# Patient Record
Sex: Female | Born: 1990 | Race: White | Hispanic: No | Marital: Married | Smoking: Never smoker
Health system: Southern US, Community
[De-identification: ages and names within clinical notes are randomized; demographics above are authoritative.]

## PROBLEM LIST (undated history)

## (undated) DIAGNOSIS — F909 Attention-deficit hyperactivity disorder, unspecified type: Secondary | ICD-10-CM

## (undated) DIAGNOSIS — K219 Gastro-esophageal reflux disease without esophagitis: Secondary | ICD-10-CM

## (undated) DIAGNOSIS — F32A Depression, unspecified: Secondary | ICD-10-CM

## (undated) DIAGNOSIS — F419 Anxiety disorder, unspecified: Secondary | ICD-10-CM

## (undated) HISTORY — DX: Depression, unspecified: F32.A

## (undated) HISTORY — DX: Attention-deficit hyperactivity disorder, unspecified type: F90.9

## (undated) HISTORY — DX: Anxiety disorder, unspecified: F41.9

## (undated) HISTORY — PX: WISDOM TOOTH EXTRACTION: SHX21

## (undated) HISTORY — PX: DACRYOCYSTORHINOPLASTY: SHX1437

---

## 2012-09-25 ENCOUNTER — Emergency Department (HOSPITAL_COMMUNITY)
Admission: EM | Admit: 2012-09-25 | Discharge: 2012-09-25 | Disposition: A | Discharge status: Home / Self Care | Payer: Managed Care, Other (non HMO) | Source: Home / Self Care | Attending: Family Medicine | Admitting: Family Medicine

## 2012-09-25 ENCOUNTER — Encounter (HOSPITAL_COMMUNITY): Payer: Self-pay

## 2012-09-25 DIAGNOSIS — R197 Diarrhea, unspecified: Secondary | ICD-10-CM

## 2012-09-25 DIAGNOSIS — R110 Nausea: Secondary | ICD-10-CM

## 2012-09-25 HISTORY — DX: Gastro-esophageal reflux disease without esophagitis: K21.9

## 2012-09-25 LAB — POCT PREGNANCY, URINE: Preg Test, Ur: NEGATIVE

## 2012-09-25 LAB — POCT URINALYSIS DIP (DEVICE)
Bilirubin Urine: NEGATIVE
Ketones, ur: NEGATIVE mg/dL
Leukocytes, UA: NEGATIVE
Protein, ur: NEGATIVE mg/dL
Specific Gravity, Urine: 1.025 (ref 1.005–1.030)

## 2012-09-25 MED ORDER — ONDANSETRON 4 MG PO TBDP: 4.0000 mg | ORAL_TABLET | Freq: Three times a day (TID) | ORAL | Status: DC | PRN

## 2012-09-25 NOTE — ED Notes (Signed)
Patient states that since 09/20/12 she has had nausea and diarrhea in the mornings, states that she has not gotten sick but thought it may be acid reflux, she tried diet change but sx have nor resolved

## 2012-09-25 NOTE — ED Provider Notes (Signed)
History     CSN: [id number]  Arrival date & time 09/25/12  1030   First MD Initiated Contact with Patient 09/25/12 1113      Chief Complaint  Patient presents with  . Nausea    (Consider location/radiation/quality/duration/timing/severity/associated sxs/prior treatment) HPI Comments: Pt had morning nausea 5 days ago and again this AM.  She has been having 4-5 episodes of diarrhea each AM for past 5 days.  No vomiting.  No melena or hematochezia.   No fever or chills.  No UTI sx.  Has changed her diet thinking that was the problem but with no effect.  No abdominal pain.  The history is provided by the patient. No language interpreter was used.    Past Medical History  Diagnosis Date  . GERD (gastroesophageal reflux disease)     History reviewed. No pertinent past surgical history.  No family history on file.  History  Substance Use Topics  . Smoking status: Never Smoker   . Smokeless tobacco: Not on file  . Alcohol Use: Yes    OB History    Grav Para Term Preterm Abortions TAB SAB Ect Mult Living                  Review of Systems  Constitutional: Negative for fever and chills.  Gastrointestinal: Positive for nausea and diarrhea. Negative for vomiting and abdominal pain.  Genitourinary: Negative for dysuria, urgency, frequency, hematuria, vaginal bleeding, vaginal discharge and pelvic pain.  All other systems reviewed and are negative.    Allergies  Review of patient's allergies indicates no known allergies.  Home Medications   Current Outpatient Rx  Name  Route  Sig  Dispense  Refill  . DROSPIRENONE-ETHINYL ESTRADIOL 3-0.03 MG PO TABS   Oral   Take 1 tablet by mouth daily.         Marland Kitchen ONDANSETRON 4 MG PO TBDP   Oral   Take 1 tablet (4 mg total) by mouth every 8 (eight) hours as needed for nausea.   10 tablet   0     BP 121/77  Pulse 106  Temp 98.4 F (36.9 C) (Oral)  Resp 18  SpO2 100%  LMP 09/12/2012  Physical Exam  Nursing note and  vitals reviewed. Constitutional: She is oriented to person, place, and time. She appears well-developed and well-nourished. No distress.  HENT:  Head: Normocephalic and atraumatic.  Eyes: EOM are normal.  Neck: Normal range of motion.  Cardiovascular: Normal rate and regular rhythm.   Pulmonary/Chest: Effort normal.  Abdominal: Soft. Normal appearance and bowel sounds are normal. She exhibits no distension. There is no hepatosplenomegaly. There is no tenderness. There is no rigidity, no rebound, no guarding, no CVA tenderness, no tenderness at McBurney's point and negative Murphy's sign.  Musculoskeletal: Normal range of motion.  Neurological: She is alert and oriented to person, place, and time.  Skin: Skin is warm and dry.  Psychiatric: She has a normal mood and affect. Judgment normal.    ED Course  Procedures (including critical care time)  Labs Reviewed  POCT URINALYSIS DIP (DEVICE) - Abnormal; Notable for the following:    Hgb urine dipstick TRACE (*)     All other components within normal limits  POCT PREGNANCY, URINE   No results found.   1. Nausea   2. Diarrhea       MDM  rx- zofran 4 mg ODT Imodium prn Return pprn        Evalina Field, [place] 09/25/12  1151 

## 2012-09-29 NOTE — ED Provider Notes (Signed)
Medical screening examination/treatment/procedure(s) were performed by resident physician or non-physician practitioner and as supervising physician I was immediately available for consultation/collaboration.   KINDL,JAMES DOUGLAS MD.    James D Kindl, MD 09/29/12 1053 

## 2013-04-16 ENCOUNTER — Emergency Department (INDEPENDENT_AMBULATORY_CARE_PROVIDER_SITE_OTHER): Payer: No Typology Code available for payment source

## 2013-04-16 ENCOUNTER — Emergency Department (HOSPITAL_COMMUNITY)
Admission: EM | Admit: 2013-04-16 | Discharge: 2013-04-16 | Disposition: A | Discharge status: Home / Self Care | Payer: No Typology Code available for payment source | Source: Home / Self Care | Attending: Emergency Medicine | Admitting: Emergency Medicine

## 2013-04-16 ENCOUNTER — Encounter (HOSPITAL_COMMUNITY): Payer: Self-pay | Admitting: Emergency Medicine

## 2013-04-16 DIAGNOSIS — K2970 Gastritis, unspecified, without bleeding: Secondary | ICD-10-CM

## 2013-04-16 DIAGNOSIS — K2990 Gastroduodenitis, unspecified, without bleeding: Secondary | ICD-10-CM

## 2013-04-16 LAB — POCT URINALYSIS DIP (DEVICE)
Bilirubin Urine: NEGATIVE
Ketones, ur: NEGATIVE mg/dL
Leukocytes, UA: NEGATIVE
Protein, ur: NEGATIVE mg/dL
Specific Gravity, Urine: 1.02 (ref 1.005–1.030)
pH: 7 (ref 5.0–8.0)

## 2013-04-16 LAB — CBC WITH DIFFERENTIAL/PLATELET
HCT: 38.3 % (ref 36.0–46.0)
Hemoglobin: 13.2 g/dL (ref 12.0–15.0)
Lymphocytes Relative: 30 % (ref 12–46)
Lymphs Abs: 1.6 10*3/uL (ref 0.7–4.0)
MCHC: 34.5 g/dL (ref 30.0–36.0)
Monocytes Absolute: 0.5 10*3/uL (ref 0.1–1.0)
Monocytes Relative: 10 % (ref 3–12)
Neutro Abs: 3 10*3/uL (ref 1.7–7.7)
Neutrophils Relative %: 56 % (ref 43–77)
RBC: 4.38 MIL/uL (ref 3.87–5.11)

## 2013-04-16 LAB — POCT I-STAT, CHEM 8
BUN: 9 mg/dL (ref 6–23)
Calcium, Ion: 1.21 mmol/L (ref 1.12–1.23)
Chloride: 110 mEq/L (ref 96–112)
Creatinine, Ser: 0.9 mg/dL (ref 0.50–1.10)
Glucose, Bld: 100 mg/dL — ABNORMAL HIGH (ref 70–99)
HCT: 39 % (ref 36.0–46.0)
Potassium: 4 mEq/L (ref 3.5–5.1)

## 2013-04-16 MED ORDER — ESOMEPRAZOLE MAGNESIUM 40 MG PO CPDR: 40.0000 mg | DELAYED_RELEASE_CAPSULE | Freq: Every day | ORAL | Status: DC

## 2013-04-16 MED ORDER — GI COCKTAIL ~~LOC~~
30.0000 mL | Freq: Once | ORAL | Status: AC
  Administered 2013-04-16: 30 mL via ORAL

## 2013-04-16 MED ORDER — DICYCLOMINE HCL 20 MG PO TABS: 20.0000 mg | ORAL_TABLET | Freq: Two times a day (BID) | ORAL | Status: DC

## 2013-04-16 MED ORDER — GI COCKTAIL ~~LOC~~
ORAL | Status: AC
  Filled 2013-04-16: qty 30

## 2013-04-16 MED ORDER — SUCRALFATE 1 G PO TABS: 1.0000 g | ORAL_TABLET | Freq: Four times a day (QID) | ORAL | Status: DC

## 2013-04-16 NOTE — ED Notes (Signed)
Pt c/o epigastric pain onset 3 days. Pain is intermittent and sharp... Last only of a couple of minutes... Taking ibup 200mg  w/no relief.  Denies: v/n/d, constipation, urinary sx She is alert and oriented w/no signs of acute distress.

## 2013-04-16 NOTE — ED Provider Notes (Signed)
Chief Complaint:   Chief Complaint  Patient presents with  . Abdominal Pain    History of Present Illness:    Kathy Mcguire is a 22 year old female who's had a three-day history of mid epigastric pain without radiation that comes and goes. Episodes can occur every 5-10 minutes and lasts a couple of seconds at a time. The pain is rated 5-6/10 in intensity. Nothing seems to precipitate the pain. It's not related to meals, exercise, activity, or exertion. Her appetite has been good and she's had no nausea or vomiting. She denies any constipation, diarrhea, or blood in the stools. 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 Bosie Clos. She's had no fever, chills, history of ulcer or gallbladder disease.  Review of Systems:  Other than noted above, the patient denies any of the following symptoms: Constitutional:  No fever, chills, fatigue, weight loss or anorexia. Lungs:  No cough or shortness of breath. Heart:  No chest pain, palpitations, syncope or edema.  No cardiac history. Abdomen:  No nausea, vomiting, hematememesis, melena, diarrhea, or hematochezia. GU:  No dysuria, frequency, urgency, or hematuria. Gyn:  No vaginal discharge, itching, abnormal bleeding, dyspareunia, or pelvic pain.  PMFSH:  Past medical history, family history, social history, meds, and allergies were reviewed along with nurse's notes.  No prior abdominal surgeries, past history of GI problems, STDs or GYN problems.  No history of aspirin or NSAID use.  No excessive alcohol intake.   Physical Exam:   Vital signs:  BP 116/56  Pulse 96  Temp(Src) 98.4 F (36.9 C) (Oral)  Resp 19  SpO2 100%  LMP 04/16/2013 Gen:  Alert, oriented, in no distress. Lungs:  Breath sounds clear and equal bilaterally.  No wheezes, rales or rhonchi. Heart:  Regular rhythm.  No gallops or murmers.   Abdomen:  Soft, flat, nondistended. No tenderness to palpation, guarding, or rebound. No organomegaly or mass.  Bowel sounds are normally active, Murphy sign and Murphy's punch were negative. Skin:  Clear, warm and dry.  No rash.  Labs:   Results for orders placed during the hospital encounter of 04/16/13  CBC WITH DIFFERENTIAL      Result Value Range   WBC 5.3  4.0 - 10.5 K/uL   RBC 4.38  3.87 - 5.11 MIL/uL   Hemoglobin 13.2  12.0 - 15.0 g/dL   HCT 16.1  09.6 - 04.5 %   MCV 87.4  78.0 - 100.0 fL   MCH 30.1  26.0 - 34.0 pg   MCHC 34.5  30.0 - 36.0 g/dL   RDW 40.9  81.1 - 91.4 %   Platelets 210  150 - 400 K/uL   Neutrophils Relative % 56  43 - 77 %   Neutro Abs 3.0  1.7 - 7.7 K/uL   Lymphocytes Relative 30  12 - 46 %   Lymphs Abs 1.6  0.7 - 4.0 K/uL   Monocytes Relative 10  3 - 12 %   Monocytes Absolute 0.5  0.1 - 1.0 K/uL   Eosinophils Relative 4  0 - 5 %   Eosinophils Absolute 0.2  0.0 - 0.7 K/uL   Basophils Relative 1  0 - 1 %   Basophils Absolute 0.0  0.0 - 0.1 K/uL  POCT URINALYSIS DIP (DEVICE)      Result Value Range   Glucose, UA NEGATIVE  NEGATIVE mg/dL   Bilirubin Urine NEGATIVE  NEGATIVE   Ketones, ur NEGATIVE  NEGATIVE mg/dL  Specific Gravity, Urine 1.020  1.005 - 1.030   Hgb urine dipstick TRACE (*) NEGATIVE   pH 7.0  5.0 - 8.0   Protein, ur NEGATIVE  NEGATIVE mg/dL   Urobilinogen, UA 0.2  0.0 - 1.0 mg/dL   Nitrite NEGATIVE  NEGATIVE   Leukocytes, UA NEGATIVE  NEGATIVE  POCT PREGNANCY, URINE      Result Value Range   Preg Test, Ur NEGATIVE  NEGATIVE  POCT I-STAT, CHEM 8      Result Value Range   Sodium 143  135 - 145 mEq/L   Potassium 4.0  3.5 - 5.1 mEq/L   Chloride 110  96 - 112 mEq/L   BUN 9  6 - 23 mg/dL   Creatinine, Ser 1.61  0.50 - 1.10 mg/dL   Glucose, Bld 096 (*) 70 - 99 mg/dL   Calcium, Ion 0.45  4.09 - 1.23 mmol/L   TCO2 25  0 - 100 mmol/L   Hemoglobin 13.3  12.0 - 15.0 g/dL   HCT 81.1  91.4 - 78.2 %  POCT H PYLORI SCREEN      Result Value Range   H. PYLORI SCREEN, POC NEGATIVE  NEGATIVE     Radiology:  Dg Abd Acute W/chest  04/16/2013    *RADIOLOGY REPORT*  Clinical Data: Epigastric pain  ACUTE ABDOMEN SERIES (ABDOMEN 2 VIEW & CHEST 1 VIEW)  Comparison: None.  Findings: The lungs are clear without focal consolidation, edema, effusion or pneumothorax.  Cardiopericardial silhouette is within normal limits for size.  Imaged bony structures of the thorax are intact.  Upright film shows no evidence for intraperitoneal free air. There is no evidence for gaseous bowel dilation to suggest obstruction. No unexpected abdominopelvic calcification.  Lucency over the inferior midline anatomic pelvis presumed to represent the presence of a tampon. Imaged bony structures are intact.  IMPRESSION: Normal exam.   Original Report Authenticated By: Kennith Center, M.D.   Course in Urgent Care Center:   She was given 30 mL of GI cocktail but this failed to improve her symptoms.  Assessment:  The encounter diagnosis was Gastritis.  Abdominal pain of uncertain cause. I don't see any evidence of gallbladder disease. She's not at all tender in the abdomen, right upper quadrant, and Murphy's sign and Murphy's punch were negative. This may be gastritis or ulcer disease. I think she'll need further evaluation by her gastroenterologist.  Plan:   1.  The following meds were prescribed:   Discharge Medication List as of 04/16/2013  3:49 PM    START taking these medications   Details  dicyclomine (BENTYL) 20 MG tablet Take 1 tablet (20 mg total) by mouth 2 (two) times daily., Starting 04/16/2013, Until Discontinued, Normal    esomeprazole (NEXIUM) 40 MG capsule Take 1 capsule (40 mg total) by mouth daily before breakfast., Starting 04/16/2013, Until Discontinued, Normal    sucralfate (CARAFATE) 1 G tablet Take 1 tablet (1 g total) by mouth 4 (four) times daily., Starting 04/16/2013, Until Discontinued, Normal       2.  The patient was instructed in symptomatic care and handouts were given. 3.  The patient was told to return if becoming worse in any way, if no better  in 3 or 4 days, and given some red flag symptoms such as vomiting, hematemesis, or fever that would indicate earlier return. 4.  Follow up with Dr. Bosie Clos soon as possible.    Reuben Likes, MD 04/16/13 2252

## 2014-11-11 ENCOUNTER — Ambulatory Visit (INDEPENDENT_AMBULATORY_CARE_PROVIDER_SITE_OTHER): Payer: 59 | Admitting: Internal Medicine

## 2014-11-11 VITALS — BP 118/64 | HR 97 | Temp 98.1°F | Resp 16 | Ht 62.25 in | Wt 144.2 lb

## 2014-11-11 DIAGNOSIS — L0591 Pilonidal cyst without abscess: Secondary | ICD-10-CM

## 2014-11-11 MED ORDER — CEPHALEXIN 500 MG PO CAPS: 500.0000 mg | ORAL_CAPSULE | Freq: Four times a day (QID) | ORAL | Status: DC

## 2014-11-12 NOTE — Progress Notes (Signed)
   Subjective:    Patient ID: Kathy Mcguire, female    DOB: 1990/12/09, 24 y.o.   MRN: [id number]  HPI history of 3 days of tenderness around the tailbone without known injury. She feels a swollen area that is tender. No prior history of problems although her brother has required surgery for p. Cyst. No fever.  There are no active problems to display for this patient.  on oral contraceptives    Review of Systems Noncontributory    Objective:   Physical Exam BP 118/64 mmHg  Pulse 97  Temp(Src) 98.1 F (36.7 C) (Oral)  Resp 16  Ht 5' 2.25" (1.581 m)  Wt 144 lb 3.2 oz (65.409 kg)  BMI 26.17 kg/m2  SpO2 98%  LMP 11/11/2014 She is tender midline sacral without redness or palpable swelling or induration except for a small fullness just to the left into the buttock. There are 2 pilonidal dimples.       Assessment & Plan:  Early pilonidal cyst  Hot soaks 20 minutes 3 times a day Keflex Follow-up if this expands for I&D

## 2016-11-29 ENCOUNTER — Ambulatory Visit (INDEPENDENT_AMBULATORY_CARE_PROVIDER_SITE_OTHER): Payer: Managed Care, Other (non HMO)

## 2016-11-29 ENCOUNTER — Ambulatory Visit (INDEPENDENT_AMBULATORY_CARE_PROVIDER_SITE_OTHER): Payer: Managed Care, Other (non HMO) | Admitting: Podiatry

## 2016-11-29 ENCOUNTER — Encounter: Payer: Self-pay | Admitting: Podiatry

## 2016-11-29 VITALS — BP 101/81 | HR 97 | Resp 16 | Ht 64.0 in | Wt 155.0 lb

## 2016-11-29 DIAGNOSIS — M79675 Pain in left toe(s): Secondary | ICD-10-CM | POA: Diagnosis not present

## 2016-11-29 DIAGNOSIS — M2040 Other hammer toe(s) (acquired), unspecified foot: Secondary | ICD-10-CM

## 2016-11-29 DIAGNOSIS — M79674 Pain in right toe(s): Secondary | ICD-10-CM

## 2016-11-29 MED ORDER — DICLOFENAC SODIUM 75 MG PO TBEC: 75.0000 mg | DELAYED_RELEASE_TABLET | Freq: Two times a day (BID) | ORAL | 2 refills | Status: DC

## 2016-11-29 NOTE — Progress Notes (Signed)
   Subjective:    Patient ID: Kathy Mcguire, female    DOB: [birth year], [age] y.o.   MRN: [id number]  HPI Chief Complaint  Patient presents with  . Toe Pain    Bilateral; Toes 2-5; pt stated,"Hurts to touch toes, hurts to bend toes; feel swollen; but has more pain in toes 2-3"; x2-3 days      Review of Systems  All other systems reviewed and are negative.      Objective:   Physical Exam        Assessment & Plan:

## 2016-12-01 NOTE — Progress Notes (Signed)
Subjective:     Patient ID: Kathy Mcguire, female   DOB: January 31, 1991, 26 y.o.   MRN: [id number]  HPI patient states that her toe started to hurt her quite a bit several days earlier and she admits that she had gone out in cold [place] we whether it boots and walked. States she does not remember other injury   Review of Systems  All other systems reviewed and are negative.      Objective:   Physical Exam  Constitutional: She is oriented to person, place, and time.  Cardiovascular: Intact distal pulses.   Musculoskeletal: Normal range of motion.  Neurological: She is oriented to person, place, and time.  Skin: Skin is warm.  Vitals reviewed.  neurovascular status intact muscle strength adequate range of motion within normal limits with patient found to have irritation of the right second digit bilateral dorsum was slight elevation of the toe and no other areas of necrosis with cool type feet and no indications of previous vasospastic disease     Assessment:     Appears to be mild circulatory issues secondary to probable cold exposure with vasospasm with no indications of ulceration    Plan:     H&P x-rays reviewed and advised on gradual warming of the feet keeping them protected with thick socks and applied padding second toe to cushion. If symptoms were to get worse or any physical changes were to occur she is to reappoint immediately  X-ray report indicated that there is no indications of digital deformity or arthritis or other indications of basal condition

## 2017-03-04 ENCOUNTER — Encounter (HOSPITAL_COMMUNITY): Payer: Self-pay

## 2017-03-04 ENCOUNTER — Emergency Department (HOSPITAL_COMMUNITY): Payer: Managed Care, Other (non HMO)

## 2017-03-04 ENCOUNTER — Emergency Department (HOSPITAL_COMMUNITY)
Admission: EM | Admit: 2017-03-04 | Discharge: 2017-03-04 | Disposition: A | Discharge status: Home / Self Care | Payer: Managed Care, Other (non HMO) | Attending: Physician Assistant | Admitting: Physician Assistant

## 2017-03-04 DIAGNOSIS — R071 Chest pain on breathing: Secondary | ICD-10-CM

## 2017-03-04 DIAGNOSIS — R079 Chest pain, unspecified: Secondary | ICD-10-CM | POA: Diagnosis present

## 2017-03-04 DIAGNOSIS — Z79899 Other long term (current) drug therapy: Secondary | ICD-10-CM | POA: Insufficient documentation

## 2017-03-04 LAB — CBC
HEMATOCRIT: 38.1 % (ref 36.0–46.0)
Hemoglobin: 13.4 g/dL (ref 12.0–15.0)
MCH: 30 pg (ref 26.0–34.0)
MCHC: 35.2 g/dL (ref 30.0–36.0)
MCV: 85.2 fL (ref 78.0–100.0)
PLATELETS: 304 10*3/uL (ref 150–400)
RBC: 4.47 MIL/uL (ref 3.87–5.11)
RDW: 12.8 % (ref 11.5–15.5)
WBC: 7.6 10*3/uL (ref 4.0–10.5)

## 2017-03-04 LAB — BASIC METABOLIC PANEL
ANION GAP: 9 (ref 5–15)
BUN: 12 mg/dL (ref 6–20)
CO2: 24 mmol/L (ref 22–32)
CREATININE: 0.83 mg/dL (ref 0.44–1.00)
Calcium: 9.2 mg/dL (ref 8.9–10.3)
Chloride: 105 mmol/L (ref 101–111)
GLUCOSE: 106 mg/dL — AB (ref 65–99)
Potassium: 3.8 mmol/L (ref 3.5–5.1)
Sodium: 138 mmol/L (ref 135–145)

## 2017-03-04 LAB — I-STAT TROPONIN, ED: Troponin i, poc: 0 ng/mL (ref 0.00–0.08)

## 2017-03-04 LAB — D-DIMER, QUANTITATIVE (NOT AT ARMC): D DIMER QUANT: 0.32 ug{FEU}/mL (ref 0.00–0.50)

## 2017-03-04 MED ORDER — CYCLOBENZAPRINE HCL 10 MG PO TABS: 10.0000 mg | ORAL_TABLET | Freq: Two times a day (BID) | ORAL | 0 refills | Status: DC | PRN

## 2017-03-04 NOTE — ED Provider Notes (Signed)
[facility]-EMERGENCY DEPT Provider Note   CSN: [id number] Arrival date & time: 03/04/17  1720     History   Chief Complaint Chief Complaint  Patient presents with  . Chest Pain  . Shortness of Breath    HPI Kathyrn Warmuth is a 26 y.o. female.  HPI   Patient's 54 old female presenting with chest pain. Patient was seen in urgent care today and sent here for evaluation for a blood clot. Patient had recent trip to [facility] (4 hour flight) and is on estrogens so was sent here for further evaluation. Patient has no history of blood clots. No swelling to bilateral lower extremities.  Patient was toting a heavy back pack on this trip to [place].    Past Medical History:  Diagnosis Date  . GERD (gastroesophageal reflux disease)     There are no active problems to display for this patient.   History reviewed. No pertinent surgical history.  OB History    No data available       Home Medications    Prior to Admission medications   Medication Sig Start Date End Date Taking? Authorizing Provider  cetirizine (ZYRTEC) 10 MG tablet Take 10 mg by mouth daily.   Yes Historical Provider, MD  drospirenone-ethinyl estradiol (OCELLA) 3-0.03 MG tablet Take 1 tablet by mouth daily.   Yes Historical Provider, MD  ibuprofen (ADVIL,MOTRIN) 600 MG tablet Take 600 mg by mouth every 6 (six) hours as needed.   Yes Historical Provider, MD  diclofenac (VOLTAREN) 75 MG EC tablet Take 1 tablet (75 mg total) by mouth 2 (two) times daily. Patient not taking: Reported on 03/04/2017 11/29/16   Lenn Sink, DPM    Family History Family History  Problem Relation Age of Onset  . Cancer Father   . Cancer Paternal Grandmother     Social History Social History  Substance Use Topics  . Smoking status: Never Smoker  . Smokeless tobacco: Never Used  . Alcohol use Yes     Comment: occasionally     Allergies   Patient has no known allergies.   Review of Systems Review of Systems  Constitutional:  Negative for activity change.  Respiratory: Negative for shortness of breath.   Cardiovascular: Positive for chest pain.  Gastrointestinal: Negative for abdominal pain.  All other systems reviewed and are negative.    Physical Exam Updated Vital Signs BP 124/78 (BP Location: Left Arm)   Pulse 100   Temp 98.5 F (36.9 C) (Oral)   Resp 18   Ht  (1.626 m)   Wt 152 lb (68.9 kg)   LMP 02/18/2017 (Approximate)   SpO2 100%   BMI 26.09 kg/m   Physical Exam  Constitutional: She is oriented to person, place, and time. She appears well-developed and well-nourished.  HENT:  Head: Normocephalic and atraumatic.  Eyes: Right eye exhibits no discharge.  Cardiovascular: Normal rate, regular rhythm and normal heart sounds.   No murmur heard. Pulmonary/Chest: Effort normal and breath sounds normal. She has no wheezes. She has no rales.  Abdominal: Soft. She exhibits no distension. There is no tenderness.  Neurological: She is oriented to person, place, and time.  Skin: Skin is warm and dry. She is not diaphoretic.  Psychiatric: She has a normal mood and affect.  Nursing note and vitals reviewed.    ED Treatments / Results  Labs (all labs ordered are listed, but only abnormal results are displayed) Labs Reviewed  BASIC METABOLIC PANEL - Abnormal; Notable for the  following:       Result Value   Glucose, Bld 106 (*)    All other components within normal limits  CBC  D-DIMER, QUANTITATIVE (NOT AT [facility])  Rosezena Sensor, ED    EKG  EKG Interpretation None       Radiology Dg Chest 2 View  Result Date: 03/04/2017 CLINICAL DATA:  Chest pain EXAM: CHEST  2 VIEW COMPARISON:  04/16/2013 chest radiograph. FINDINGS: Stable cardiomediastinal silhouette with normal heart size. No pneumothorax. No pleural effusion. Lungs appear clear, with no acute consolidative airspace disease and no pulmonary edema. IMPRESSION: No active cardiopulmonary disease. Electronically Signed   By: Delbert Phenix M.D.   On: 03/04/2017 18:40    Procedures Procedures (including critical care time)  Medications Ordered in ED Medications - No data to display   Initial Impression / Assessment and Plan / ED Course  I have reviewed the triage vital signs and the nursing notes.  Pertinent labs & imaging results that were available during my care of the patient were reviewed by me and considered in my medical decision making (see chart for details).     Patient is a 26 year old female presenting from urgent care for rule out PE. Patient had a 4 hour flight to [facility] and is on estrogens. Of note the flight does not meet criteria for prolonged stasis. Patient's d-dimer is negative. EKG is nonischemic. Patient has no shortness of breath. I don't think further evaluation is necessary as d-dimer is very sensitive in these cases.   Patient will follow-up if she continues to have symptoms.  Patient is comfortable, ambulatory, and taking PO at time of discharge.  Patient expressed understanding about return precautions.    Final Clinical Impressions(s) / ED Diagnoses   Final diagnoses:  None    New Prescriptions New Prescriptions   No medications on file     Martha Ellerby Randall An, MD 03/04/17 2156

## 2017-03-04 NOTE — ED Triage Notes (Signed)
Patient has been flying from another state and is now having mid and right chest pain that is worse when taking a deep breath. Patient also c/o pain radiating into the neck.

## 2017-03-04 NOTE — Discharge Instructions (Signed)
You were seen today for pain in her chest. Your screening lab (d-dimer) is negative. Going to follow up with her primary care physician in 2 days. Please return immediately if worsening, chest pain, short of breath, or other concerns.

## 2017-09-24 ENCOUNTER — Ambulatory Visit: Payer: Managed Care, Other (non HMO) | Admitting: Podiatry

## 2017-09-24 ENCOUNTER — Ambulatory Visit (INDEPENDENT_AMBULATORY_CARE_PROVIDER_SITE_OTHER): Payer: Managed Care, Other (non HMO)

## 2017-09-24 ENCOUNTER — Encounter: Payer: Self-pay | Admitting: Podiatry

## 2017-09-24 DIAGNOSIS — M2041 Other hammer toe(s) (acquired), right foot: Secondary | ICD-10-CM | POA: Diagnosis not present

## 2017-09-24 DIAGNOSIS — M79671 Pain in right foot: Secondary | ICD-10-CM

## 2017-09-24 DIAGNOSIS — M2040 Other hammer toe(s) (acquired), unspecified foot: Secondary | ICD-10-CM

## 2017-09-25 ENCOUNTER — Telehealth: Payer: Self-pay | Admitting: Podiatry

## 2017-09-25 NOTE — Progress Notes (Signed)
Subjective:    Patient ID: Kathy PaymentKimberly Mcguire, female   DOB: 26 y.o.   MRN: [id number]   HPI patient presents stating she started develop same symptoms she had last year during the cold weather. 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 I then went ahead and I placed on diclofenac 75 mg twice a day instructed on warm water soaks and reappoint if any symptoms were to occur

## 2017-09-26 NOTE — Telephone Encounter (Signed)
I saw Dr. Charlsie Merlesegal yesterday and he was supposed to send an Rx for diclofenac to [facility] [address] and they have not received the prescription yet. Can you please get that called in then call and let me know at [phone]. Thanks so much.

## 2017-09-29 ENCOUNTER — Other Ambulatory Visit: Payer: Self-pay | Admitting: Podiatry

## 2017-09-29 ENCOUNTER — Telehealth: Payer: Self-pay | Admitting: *Deleted

## 2017-09-29 MED ORDER — DICLOFENAC SODIUM 75 MG PO TBEC: 75.0000 mg | DELAYED_RELEASE_TABLET | Freq: Two times a day (BID) | ORAL | 2 refills | Status: DC

## 2017-09-29 NOTE — Telephone Encounter (Signed)
Left message for patient to let them know that I sent their prescription for Diclofenac 75 mg, Dispensed 60 with 2 refills to [facility] [address] in [place], [place]

## 2017-10-24 ENCOUNTER — Telehealth: Payer: Self-pay | Admitting: *Deleted

## 2017-10-24 MED ORDER — DICLOFENAC SODIUM 75 MG PO TBEC: 75.0000 mg | DELAYED_RELEASE_TABLET | Freq: Two times a day (BID) | ORAL | 0 refills | Status: DC

## 2017-10-24 NOTE — Telephone Encounter (Signed)
Refill request for diclofenac. Dr. Regal ordered refill once, pt needs an appt. 

## 2017-11-12 ENCOUNTER — Other Ambulatory Visit: Payer: Self-pay | Admitting: Podiatry

## 2017-11-12 DIAGNOSIS — M2041 Other hammer toe(s) (acquired), right foot: Secondary | ICD-10-CM

## 2019-02-04 IMAGING — CR DG CHEST 2V
2 series · 2 of 2 positions shown · non-contrast
Comparison: 04/16/2013 chest radiograph.

CLINICAL DATA: Chest pain

EXAM:
CHEST  2 VIEW

[w chest pa]
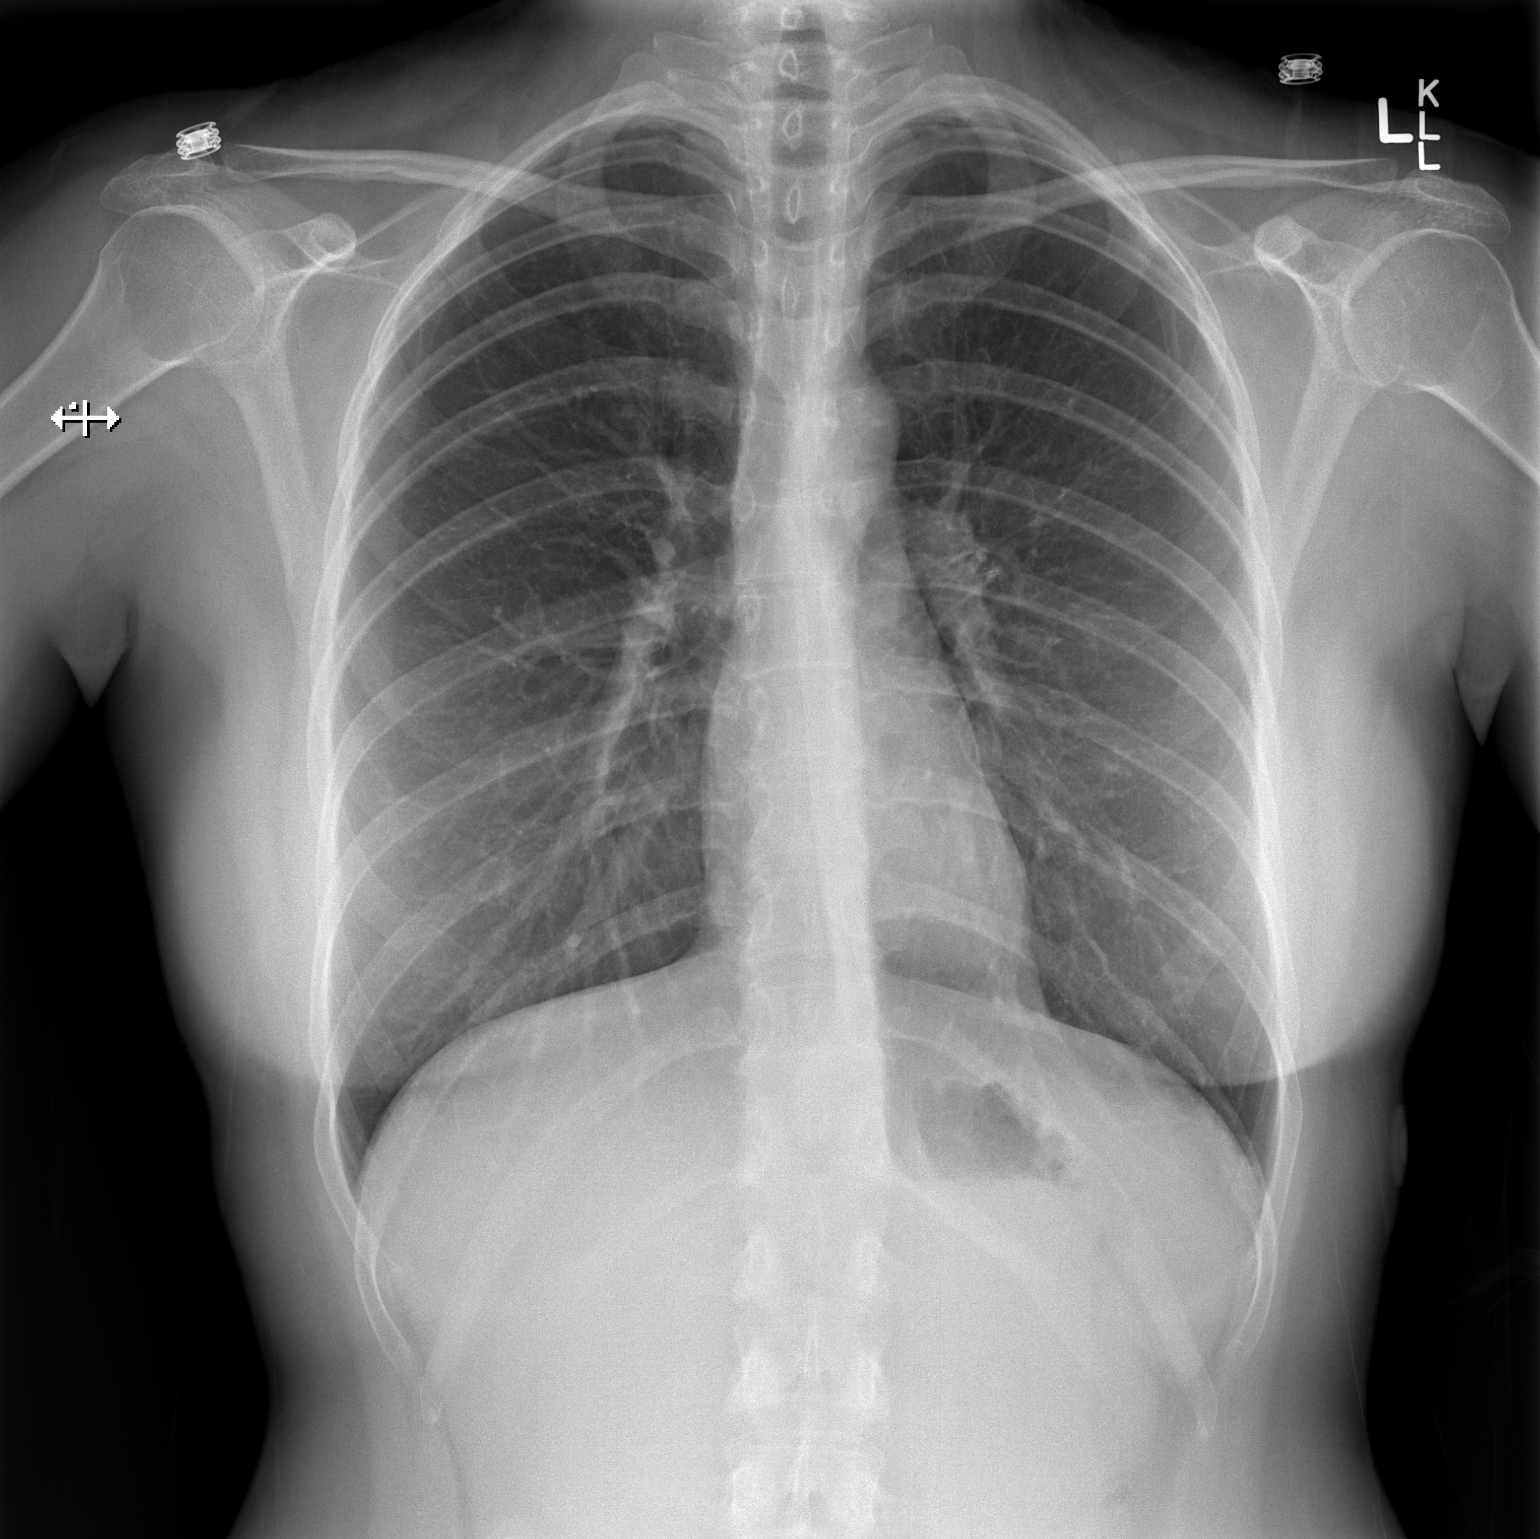

[w chest lat]
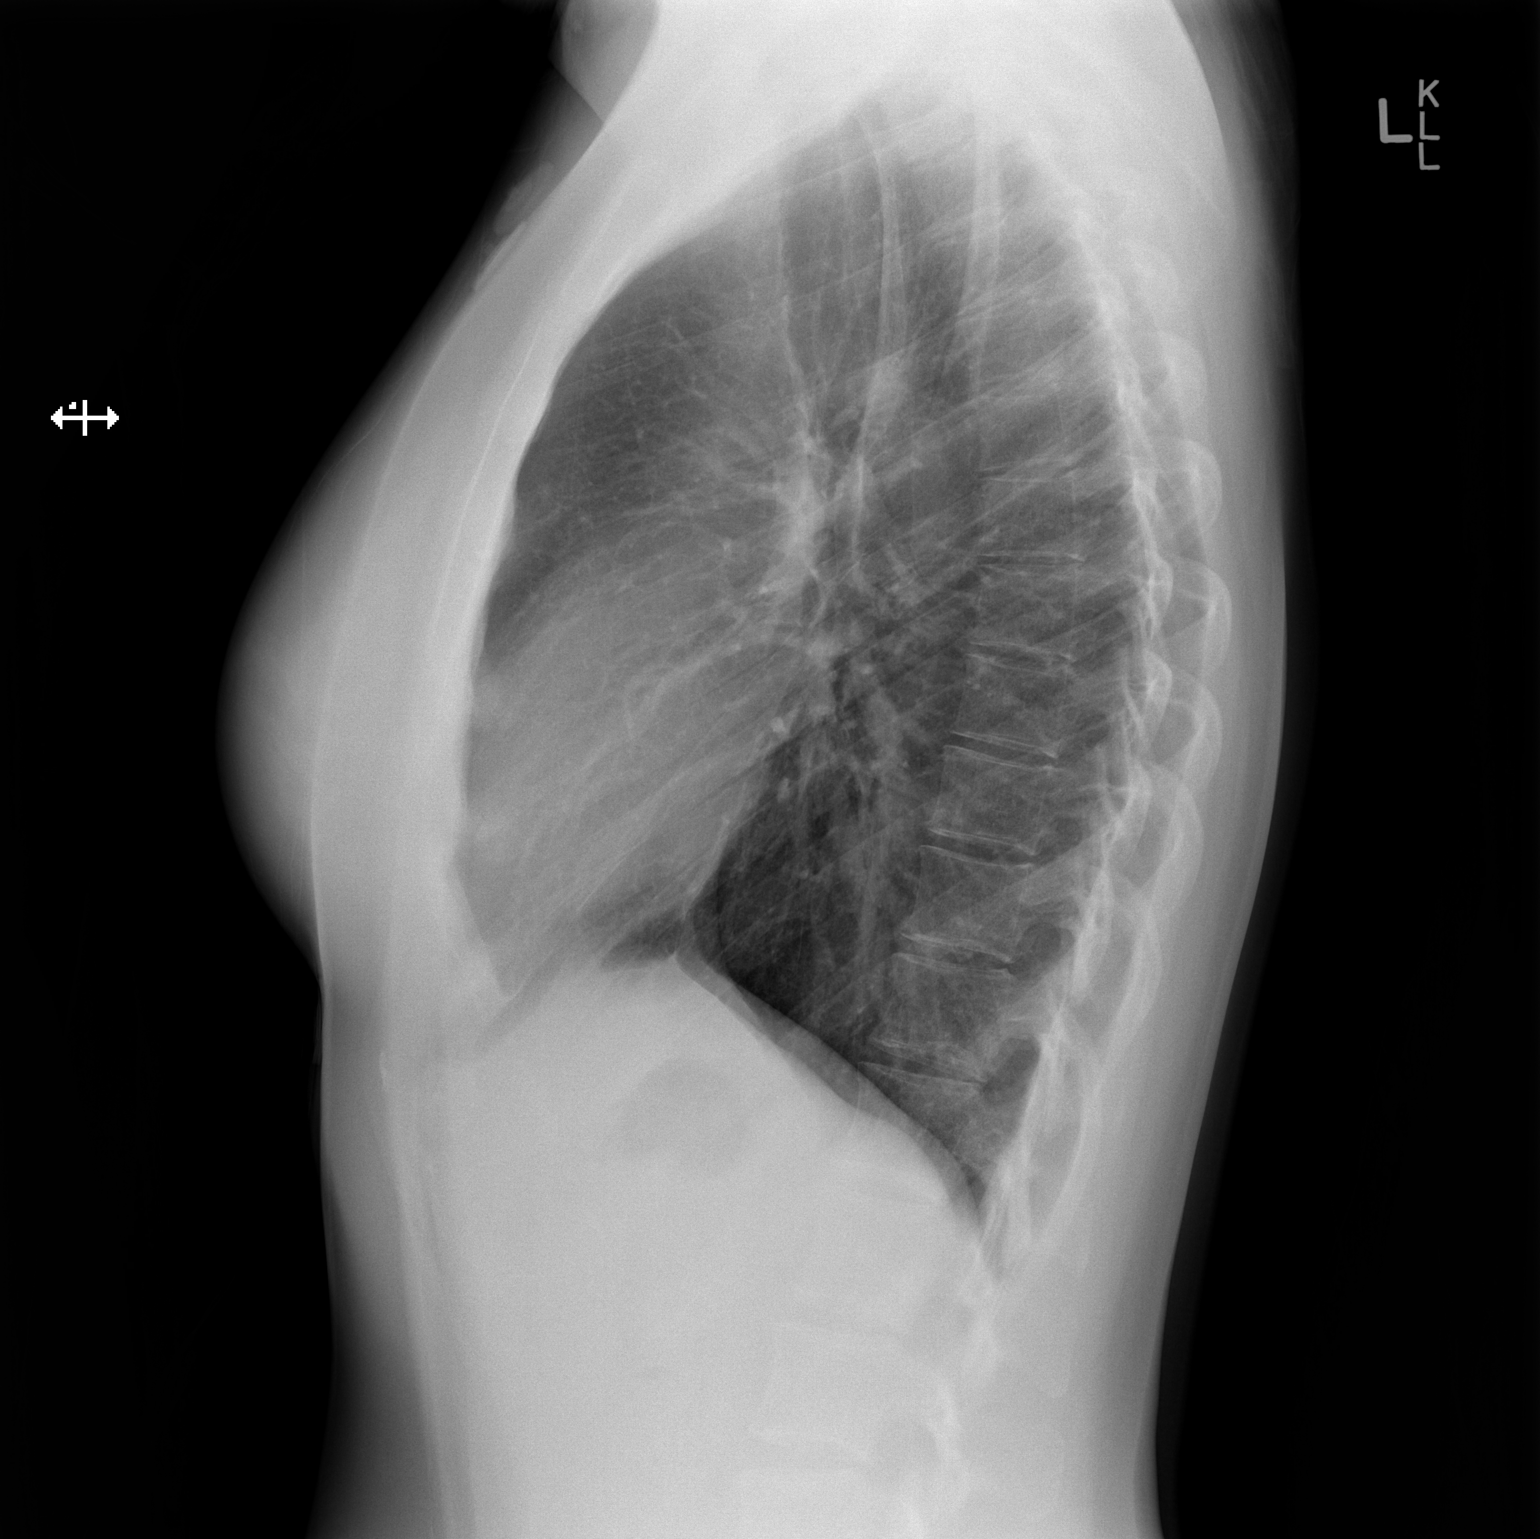

[2 of 2 positions shown; findings below may reference images not displayed]

FINDINGS: Stable cardiomediastinal silhouette with normal heart size. No
pneumothorax. No pleural effusion. Lungs appear clear, with no acute
consolidative airspace disease and no pulmonary edema.
IMPRESSION: No active cardiopulmonary disease.

## 2020-01-25 ENCOUNTER — Other Ambulatory Visit: Payer: Self-pay

## 2020-01-25 ENCOUNTER — Ambulatory Visit (INDEPENDENT_AMBULATORY_CARE_PROVIDER_SITE_OTHER): Payer: 59 | Admitting: Licensed Clinical Social Worker

## 2020-01-25 ENCOUNTER — Encounter: Payer: Self-pay | Admitting: Licensed Clinical Social Worker

## 2020-01-25 DIAGNOSIS — F411 Generalized anxiety disorder: Secondary | ICD-10-CM | POA: Diagnosis not present

## 2020-01-25 NOTE — Progress Notes (Signed)
Virtual Visit via Video Note  I connected with Kathy Mcguire on 01/25/20 at  9:00 AM EDT by a video enabled telemedicine application and verified that I am speaking with the correct person using two identifiers.   I discussed the limitations of evaluation and management by telemedicine and the availability of in person appointments. The patient expressed understanding and agreed to proceed.  Comprehensive Clinical Assessment (CCA) Note  01/25/2020 Kathy Mcguire [id number]  Visit Diagnosis:      ICD-10-CM   1. Generalized anxiety disorder  F41.1       CCA Part One  Part One has been completed on paper by the patient.  (See scanned document in Chart Review)  CCA Part Two A  Intake/Chief Complaint:  CCA Intake With Chief Complaint CCA Part Two Date: 01/25/20 CCA Part Two Time: 0900 Chief Complaint/Presenting Problem: Pt presents as a 29 year old, Caucasian, female for assessment. 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 Pt reported "I know that I have issues with stress management. I get overwhelmed and have a hard time coming back from that. I get frustrated and angry. I had to disable all sounds from my app notifications for work. I take things personally. I got unhandled issues that have happened where I ignore specific feelings and don't let myself think about certain things". Patients Currently Reported Symptoms/Problems: Anxiety, Panic Attacks, Work Related Stress, Lack of Motivation Collateral Involvement: N/A Individual's Strengths: Pt reported "I have the freedom to step away from work. I have my pets. My husband is great". Individual's Preferences: Pt reported "I tried seeing a Education officer, museum a few years ago, but didn't work out. What is not helpful is breathing techniques. I don't trust myself to do them on a regular schedule". Individual's Abilities: Pt works full time from home, is married and takes Environmental consultant. Type of Services Patient Feels Are Needed:  Individual Therapy Initial Clinical Notes/Concerns: N/A  Mental Health Symptoms Depression:  Depression: Tearfulness  Mania:  Mania: N/A  Anxiety:   Anxiety: Tension, Worrying, Sleep, Difficulty concentrating, Fatigue, Irritability(hard time falling asleep - my brain just won't shut down)  Psychosis:  Psychosis: N/A  Trauma:  Trauma: Avoids reminders of event, Emotional numbing(Father died a few years ago)  Obsessions:  Obsessions: N/A  Compulsions:  Compulsions: N/A  Inattention:  Inattention: Disorganized, Forgetful  Hyperactivity/Impulsivity:  Hyperactivity/Impulsivity: N/A  Oppositional/Defiant Behaviors:  Oppositional/Defiant Behaviors: N/A  Borderline Personality:  Emotional Irregularity: N/A  Other Mood/Personality Symptoms:  Other Mood/Personality Symtpoms: N/A   Mental Status Exam Appearance and self-care  Stature:  Stature: Average  Weight:  Weight: Average weight  Clothing:  Clothing: Casual  Grooming:  Grooming: Normal  Cosmetic use:  Cosmetic Use: Age appropriate  Posture/gait:  Posture/Gait: Normal  Motor activity:  Motor Activity: Not Remarkable  Sensorium  Attention:  Attention: Normal  Concentration:  Concentration: Normal  Orientation:  Orientation: X5  Recall/memory:  Recall/Memory: Normal  Affect and Mood  Affect:  Affect: Appropriate  Mood:  Mood: Anxious  Relating  Eye contact:  Eye Contact: Normal  Facial expression:  Facial Expression: Responsive  Attitude toward examiner:  Attitude Toward Examiner: Cooperative  Thought and Language  Speech flow: Speech Flow: Normal  Thought content:  Thought Content: Appropriate to mood and circumstances  Preoccupation:  Preoccupations: (N/A)  Hallucinations:  Hallucinations: (N/A)  Organization:     Transport planner of Knowledge:  Fund of Knowledge: Average  Intelligence:  Intelligence: Above Average  Abstraction:  Abstraction: Normal  Judgement:  Judgement: Fair  Dance movement psychotherapist:  Reality Testing:  Adequate  Insight:  Insight: Flashes of insight, Gaps  Decision Making:  Decision Making: Normal  Social Functioning  Social Maturity:  Social Maturity: Responsible  Social Judgement:  Social Judgement: Normal  Stress  Stressors:  Stressors: Grief/losses, Transitions, Work(relationship and responsibilities at home)  Coping Ability:  Coping Ability: Resilient  Skill Deficits:   Pt reported lack of motivation  Supports:   Pt reported difficulty asking for help at times   Family and Psychosocial History: Family history Marital status: Married Number of Years Married: 5 What types of issues is patient dealing with in the relationship?: Pt reported "I have a hard time feeling motivated to do things like dinner. My husband feels like he is the one that has to do a lot of projects around the house and thinks I am too distracted by social media. He has to act like my parent". Are you sexually active?: Yes Does patient have children?: No  Childhood History:  Childhood History By whom was/is the patient raised?: Mother/father and step-parent, Father Additional childhood history information: Pt reported that her parents divorced when she was age 54 and mother remarried when patient was age 8. Description of patient's relationship with caregiver when they were a child: Pt reported "Dad was gone for work Theme park manager" and felt that her parents were "more restrictive on on me" than her brothers. "My stepdad had mood swings and sometimes would take it out on me". Relationship with "Mom was great". Patient's description of current relationship with people who raised him/her: Pt reported "things are good, but don't see each other much since the pandemic. We try to catch up once per week". How were you disciplined when you got in trouble as a child/adolescent?: Pt reported "I was typically grounded, never physically punished". Does patient have siblings?: Yes Number of Siblings: 3 Description of patient's current  relationship with siblings: Pt reported "good, but at the same time we don't really talk. My youngest brother is about to turn 75. I left the house when he was really young". 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 Did You Receive Any Psychiatric Treatment/Services While in the Military?: No Are There Guns or Other Weapons in Your Home?: No  Education: Education Last Grade Completed: 16 Did You Graduate From [employer]?: Yes Did You Attend College?: Yes What Type of College Degree Do you Have?: BA in Psychology Did You Attend Graduate School?: No Did You Have An Individualized Education Program (IIEP): No Did You Have Any Difficulty At School?: Yes(Difficulty focusing on reading and missed school going to neurology appointments that resulted in failing a class sophomore year of HS.) Were Any Medications Ever Prescribed For These Difficulties?: No  Religion: Religion/Spirituality Are You A Religious Person?: No  Leisure/Recreation: Leisure / Recreation Leisure and Hobbies: Pt reported "I spend most of my time watching TV or on the internet/social media. I take piano lessons and I am learning how to play bass  guitar".  Exercise/Diet: Exercise/Diet Do You Exercise?: No Have You Gained or Lost A Significant Amount of Weight in the Past Six Months?: No Do You Follow a Special Diet?: No Do You Have Any Trouble Sleeping?: No  CCA Part Two C  Alcohol/Drug Use: Alcohol / Drug Use Pain Medications: N/A Prescriptions: N/A Over the Counter: N/A History of alcohol / drug use?: No history of alcohol / drug abuse(Pt reported "I occasionally drink".) Longest period of sobriety (when/how long): N/A Negative Consequences of Use: (N/A) Withdrawal Symptoms: (N/A)                      CCA Part Three  Substance use Disorder (SUD) Substance Use Disorder (SUD)  Checklist Symptoms of Substance Use: (N/A)  Social Function:  Social Functioning Social Maturity: Responsible Social Judgement: Normal  Stress:  Stress Stressors: Grief/losses, Transitions, Work(relationship and responsibilities at home) Coping Ability: Resilient Patient Takes Medications The Way The Doctor Instructed?: No(prescribed buspirone for panic attack) Priority Risk: Low Acuity  Risk Assessment- Self-Harm Potential: Risk Assessment For Self-Harm Potential Thoughts of Self-Harm: No current thoughts Method: No plan Availability of Means: No access/NA Additional Information for Self-Harm Potential: (N/A) Additional Comments for Self-Harm Potential: N/A  Risk Assessment -Dangerous to Others Potential: Risk Assessment For Dangerous to Others Potential Method: No Plan Availability of Means: No access or NA Intent: Vague intent or NA Notification Required: No need or identified person Additional Information for Danger to Others Potential: (N/A) Additional Comments for Danger to Others Potential: N/A  DSM5 Diagnoses: There are no problems to display for this patient.   Patient Centered Plan: Patient is on the following Treatment Plan(s):  Anxiety  Recommendations for Services/Supports/Treatments: Recommendations  for Services/Supports/Treatments Recommendations For Services/Supports/Treatments: Individual Therapy  Treatment Plan Summary: Long Term Goal: Reduce overall level, frequency, and intensity of the anxiety so that daily functioning is not impaired  Short Term Goals: . Identify the major life conflicts from the past and present that form the basis for present anxiety . Describe current and past experiences with specific fears, prominent worries, and anxiety symptoms including their impact on functioning and attempts to resolve it . Identify an anxiety coping mechanism that has been successful in the past and increase its use . Increase understanding of beliefs and messages that produce the worry and anxiety  Follow Up Instructions: I discussed the assessment and treatment plan with the patient. The patient was provided an opportunity to ask questions and all were answered. The patient agreed with the plan and demonstrated an understanding of the instructions.   The patient was advised to call back or seek an in-person evaluation if the symptoms worsen or if the condition fails to improve as anticipated.  I provided 60 minutes of non-face-to-face time during this encounter.   Gwendalynn Eckstrom Arnette Felts, LCSW, LCAS

## 2020-02-17 ENCOUNTER — Ambulatory Visit: Payer: 59 | Admitting: Licensed Clinical Social Worker

## 2020-06-13 ENCOUNTER — Other Ambulatory Visit: Payer: Self-pay

## 2020-06-13 ENCOUNTER — Encounter: Payer: Self-pay | Admitting: Licensed Clinical Social Worker

## 2020-06-13 ENCOUNTER — Ambulatory Visit (INDEPENDENT_AMBULATORY_CARE_PROVIDER_SITE_OTHER): Payer: 59 | Admitting: Licensed Clinical Social Worker

## 2020-06-13 DIAGNOSIS — F411 Generalized anxiety disorder: Secondary | ICD-10-CM | POA: Diagnosis not present

## 2020-06-13 NOTE — Progress Notes (Signed)
Patient Location: Home  Provider Location: Home Office   Virtual Visit via Video Note  I connected with Kathy Mcguire on 06/13/20 at  8:00 AM EDT by a video enabled telemedicine application and verified that I am speaking with the correct person using two identifiers.   I discussed the limitations of evaluation and management by telemedicine and the availability of in person appointments. The patient expressed understanding and agreed to proceed.  THERAPY PROGRESS NOTE  Session Time: 70 Minutes  Participation Level: Active  Behavioral Response: Well GroomedAlertAnxious  Type of Therapy: Individual Therapy  Treatment Goals addressed: Anxiety and Coping  Interventions: CBT  Summary: Kathy Mcguire is a 29 y.o. female who presents with anxiety sxs. Pt reported since last session she has had some realizations and feel differently regarding relationships with family and how she is managing her anxiety. Pt identified current stressors and ranked them in order of most to least anxiety provoking. Pt identified triggers for work related stress and difficulty managing work-life balance that is causing arguments in her relationship with spouse. Pt was receptive to trial use of cues and grounding techniques to transition from work role to her other roles that patient believes she is neglecting.  Suicidal/Homicidal: No  Therapist Response: Therapist met with patient for first session since completing CCA in March 2021. Therapist returning from leave. Therapist and patient reviewed and updated treatment plan to reflect progress and needs. Pt in agreement with treatment plan. Therapist and patient discussed primary stressors and coping skills patient uses that are and are not working. Therapist assigned patient homework of using reminders and other physical cues to disrupt ruminating on work outside of working hours and allow patient more time to spend recharging and focusing on her important  relationships with loved ones.  Plan: Return again in 2 weeks.  Diagnosis: Axis I: Generalized Anxiety Disorder    Axis II: N/A   Follow Up Instructions:   I discussed the treatment plan with the patient. The patient was provided an opportunity to ask questions and all were answered. The patient agreed with the plan and demonstrated an understanding of the instructions.   The patient was advised to call back or seek an in-person evaluation if the symptoms worsen or if the condition fails to improve as anticipated.  I provided 60 minutes of non-face-to-face time during this encounter.   Raniya Golembeski Wynelle Link, LCSW, LCAS

## 2020-06-27 ENCOUNTER — Other Ambulatory Visit: Payer: Self-pay

## 2020-06-27 ENCOUNTER — Encounter: Payer: Self-pay | Admitting: Licensed Clinical Social Worker

## 2020-06-27 ENCOUNTER — Ambulatory Visit (INDEPENDENT_AMBULATORY_CARE_PROVIDER_SITE_OTHER): Payer: 59 | Admitting: Licensed Clinical Social Worker

## 2020-06-27 DIAGNOSIS — F411 Generalized anxiety disorder: Secondary | ICD-10-CM | POA: Diagnosis not present

## 2020-06-27 NOTE — Progress Notes (Signed)
Patient Location: Home  Provider Location: Home Office   Virtual Visit via Video Note  I connected with Kathy Mcguire on 06/27/20 at  8:00 AM EDT by a video enabled telemedicine application and verified that I am speaking with the correct person using two identifiers.   I discussed the limitations of evaluation and management by telemedicine and the availability of in person appointments. The patient expressed understanding and agreed to proceed.  THERAPY PROGRESS NOTE  Session Time: 30 Minutes  Participation Level: Active  Behavioral Response: Well GroomedAlertAnxious  Type of Therapy: Individual Therapy  Treatment Goals addressed: Anger, Anxiety and Coping  Interventions: CBT  Summary: Kathy Mcguire is a 29 y.o. female who presents with anxiety sxs. Pt reported since last session she has been able to manage her time and transitions better. Pt identified ways that she can set boundaries with others and reviewed pros and cons for staying in her current work setting. Pt reported that her supervisor is supportive and has encouraged patient to take some vacation time and patient has plans to go to the mountains and beach with her friends over the next few weeks.   Suicidal/Homicidal: No  Therapist Response: Therapist met with patient for follow up session. Therapist and patient reviewed homework assignment. Therapist and patient discussed managing work-life balance and satisfaction by setting boundaries and reflecting on expectations and various perspectives. Pt was very receptive.  Plan: Return again in 2 weeks.  Diagnosis: Axis I: Generalized Anxiety Disorder    Axis II: N/A  Josephine Igo, LCSW, LCAS 06/27/2020

## 2020-07-10 ENCOUNTER — Encounter: Payer: Self-pay | Admitting: Licensed Clinical Social Worker

## 2020-07-10 ENCOUNTER — Ambulatory Visit (INDEPENDENT_AMBULATORY_CARE_PROVIDER_SITE_OTHER): Payer: 59 | Admitting: Licensed Clinical Social Worker

## 2020-07-10 ENCOUNTER — Other Ambulatory Visit: Payer: Self-pay

## 2020-07-10 DIAGNOSIS — F411 Generalized anxiety disorder: Secondary | ICD-10-CM | POA: Diagnosis not present

## 2020-07-10 NOTE — Progress Notes (Signed)
Patient Location: Home  Provider Location: Home Office   Virtual Visit via Video Note  I connected with Kathy Mcguire on 07/10/20 at  8:00 AM EDT by a video enabled telemedicine application and verified that I am speaking with the correct person using two identifiers.   I discussed the limitations of evaluation and management by telemedicine and the availability of in person appointments. The patient expressed understanding and agreed to proceed.  THERAPY PROGRESS NOTE  Session Time: 22 Minutes  Participation Level: Active  Behavioral Response: Casual and Well GroomedAlertAnxious  Type of Therapy: Individual Therapy  Treatment Goals addressed: Anxiety and Coping  Interventions: CBT  Summary: Kathy Mcguire is a 29 y.o. female who presents with anxiety sxs. Pt reported since last session she has had some time off of work and went on a trip with friends to the mountains. 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 Pt identified triggers of anxiety and reflected back on her formative years and relationships with family. Pt acknowledged her needs and next steps in asking for more support both in her personal and professional life.  Suicidal/Homicidal: No  Therapist Response: Therapist met with patient for follow up session. Therapist and patient discussed triggers of anxiety, family dynamics and unmet needs. Therapist provided feedback and observations on patient reflections and encouraged patient to identify thoughts and feelings w/out judging them as good or bad. Pt was receptive. Therapist assigned patient homework of practicing communicating needs with family and co-workers.  Plan: Return again in 2 weeks.  Diagnosis: Axis I: Generalized Anxiety Disorder    Axis II: N/A  Josephine Igo, LCSW, LCAS 07/10/2020

## 2020-07-24 ENCOUNTER — Encounter: Payer: Self-pay | Admitting: Licensed Clinical Social Worker

## 2020-07-24 ENCOUNTER — Other Ambulatory Visit: Payer: Self-pay

## 2020-07-24 ENCOUNTER — Ambulatory Visit (INDEPENDENT_AMBULATORY_CARE_PROVIDER_SITE_OTHER): Payer: 59 | Admitting: Licensed Clinical Social Worker

## 2020-07-24 DIAGNOSIS — F411 Generalized anxiety disorder: Secondary | ICD-10-CM

## 2020-07-24 NOTE — Progress Notes (Signed)
Patient Location: Home  Provider Location: Home Office   Virtual Visit via Video Note  I connected with Kathy Mcguire on 07/24/20 at  8:00 AM EDT by a video enabled telemedicine application and verified that I am speaking with the correct person using two identifiers.   I discussed the limitations of evaluation and management by telemedicine and the availability of in person appointments. The patient expressed understanding and agreed to proceed.  THERAPY PROGRESS NOTE  Session Time: 15 Minutes  Participation Level: Active  Behavioral Response: CasualAlertDepressed  Type of Therapy: Individual Therapy  Treatment Goals addressed: Communication: Work and Coping  Interventions: CBT  Summary: Kathy Mcguire is a 29 y.o. female who presents with some depression and anxiety sxs. Pt reported since last session she took a week off from work and has dreaded going back this morning. Pt reported feelings of sadness, crying spells and low motivation. Pt reported that family and spouse continue to tell her to leave her current job and look for other positions, however pt reported she does not want to leave at this time. Pt acknowledged that change is needed, but believes she is stuck and does not know what passions to pursue or career path to follow.   Suicidal/Homicidal: No  Therapist Response: Therapist met with patient for follow up session. Therapist and patient reviewed homework assignment. Therapist assigned patient explored current thoughts and feelings and goals for the future. Therapist provided psychoeducation on SMART goals. Pt was receptive. Therapist assigned patient homework of choosing pleasant activities to try at least once per week.  Plan: Return again in 2 weeks.  Diagnosis: Axis I: Generalized Anxiety Disorder    Axis II: N/A  Josephine Igo, LCSW, LCAS 07/24/2020

## 2020-08-07 ENCOUNTER — Ambulatory Visit (INDEPENDENT_AMBULATORY_CARE_PROVIDER_SITE_OTHER): Payer: 59 | Admitting: Licensed Clinical Social Worker

## 2020-08-07 ENCOUNTER — Other Ambulatory Visit: Payer: Self-pay

## 2020-08-07 ENCOUNTER — Encounter: Payer: Self-pay | Admitting: Licensed Clinical Social Worker

## 2020-08-07 DIAGNOSIS — F411 Generalized anxiety disorder: Secondary | ICD-10-CM

## 2020-08-07 NOTE — Progress Notes (Signed)
Patient Location: Home  Provider Location: Home Office   Virtual Visit via Video Note  I connected with [name] on 08/07/20 at  8:00 AM EDT by a video enabled telemedicine application and verified that I am speaking with the correct person using two identifiers.   I discussed the limitations of evaluation and management by telemedicine and the availability of in person appointments. The patient expressed understanding and agreed to proceed.  THERAPY PROGRESS NOTE  Session Time: 7 Minutes  Participation Level: Active  Behavioral Response: CasualAlertAnxious  Type of Therapy: Individual Therapy  Treatment Goals addressed: Anxiety, Communication: Assertiveness and Coping  Interventions: CBT  Summary: Kathy Mcguire is a 29 y.o. female who presents with anxiety and depression sxs. Pt reported that things are going "okay" since last session and no longer has a sense of dread going into work. Pt reported she talked with her manager about not working overtime and not taking more tasks on her plate than necessary. Pt reported her manager was supportive. Pt acknowledged tendency to "people please" and feels guilty for saying no. Pt provided examples of how this plays out in her personal life as well. Pt would like to work on communicating needs and wants more clearly with others and getting less distracted.  Suicidal/Homicidal: No  Therapist Response: Therapist met with patient for follow up session. Therapist and patient reviewed homework assignment. Therapist and patient explored sources of anxiety around pleasing others as well as ways patient can make more time for herself and increase focus on prioritizing tasks that need to be done vs those that can wait.  Plan: Return again in 2 weeks.  Diagnosis: Axis I: Generalized Anxiety Disorder    Axis II: N/A  Josephine Igo, LCSW, LCAS 08/07/2020

## 2020-08-21 ENCOUNTER — Encounter: Payer: Self-pay | Admitting: Licensed Clinical Social Worker

## 2020-08-21 ENCOUNTER — Other Ambulatory Visit: Payer: Self-pay

## 2020-08-21 ENCOUNTER — Ambulatory Visit (INDEPENDENT_AMBULATORY_CARE_PROVIDER_SITE_OTHER): Payer: 59 | Admitting: Licensed Clinical Social Worker

## 2020-08-21 DIAGNOSIS — F411 Generalized anxiety disorder: Secondary | ICD-10-CM

## 2020-08-21 DIAGNOSIS — F330 Major depressive disorder, recurrent, mild: Secondary | ICD-10-CM | POA: Diagnosis not present

## 2020-08-21 NOTE — Progress Notes (Signed)
Patient Location: Home  Provider Location: Home Office   Virtual Visit via Video Note  I connected with Kathy Mcguire on 08/21/20 at  8:00 AM EDT by a video enabled telemedicine application and verified that I am speaking with the correct person using two identifiers.   I discussed the limitations of evaluation and management by telemedicine and the availability of in person appointments. The patient expressed understanding and agreed to proceed.  Comprehensive Clinical Re-Assessment (CCA) Note  08/21/2020 Kathy Mcguire [id number]  Visit Diagnosis:      ICD-10-CM   1. Generalized anxiety disorder  F41.1   2. Major depressive disorder, recurrent, mild (HCC)  F33.0      CCA Biopsychosocial  Intake/Chief Complaint:  CCA Intake With Chief Complaint CCA Part Two Date: 08/21/20 CCA Part Two Time: 0800 Chief Complaint/Presenting Problem: Pt presents as a 29 year old, Caucasian, married female for assessment. Pt has been meeting with this therapist since March 2021. Therapist and patient have been working on reducing intensity and frequency of anxiety sxs due to work related stress. Pt reported she is communicating more to her supports about her feelings and needs as well as reducing intensity of anger in reaction to stressors. Pt reported she continues to struggle with feeling guilty about taking on less work and does not feel like she is meeting her obligations as a spouse. Pt would like to work on improving focus using mindfulness skills and increasing motivation to engage in hobbies while continuing to manage anxiety at work. Patient's Currently Reported Symptoms/Problems: Anxiety, Panic Attacks, Work Related Stress, Lack of Motivation, Depression, Negative Self-Talk, Relationships Individual's Strengths: Pt reported "I feel like I have more confidence in talking about what I am feeling with my spouse" and asking for what she needs for support. Individual's Preferences: Pt reported "I tried  seeing a Child psychotherapist a few years ago, but didn't work out. What is not helpful is breathing techniques. I don't trust myself to do them on a regular schedule". Individual's Abilities: Pt works full time from home, is married and takes Radiographer, therapeutic. Type of Services Patient Feels Are Needed: Individual Therapy  Mental Health Symptoms Depression:  Depression: Tearfulness, Difficulty Concentrating, Fatigue, Increase/decrease in appetite, Sleep (too much or little), Duration of symptoms greater than two weeks, Change in energy/activity  Mania:  Mania: N/A  Anxiety:   Anxiety: Tension, Worrying, Sleep, Difficulty concentrating, Fatigue, Irritability, Restlessness  Psychosis:  Psychosis: None  Trauma:  Trauma: Avoids reminders of event, Emotional numbing (Father died a few years ago)  Obsessions:  Obsessions: N/A  Compulsions:  Compulsions: N/A  Inattention:  Inattention: Disorganized, Forgetful, Poor follow-through on tasks  Hyperactivity/Impulsivity:  Hyperactivity/Impulsivity: Fidgets with hands/feet  Oppositional/Defiant Behaviors:  Oppositional/Defiant Behaviors: N/A  Emotional Irregularity:  Emotional Irregularity: N/A  Other Mood/Personality Symptoms:  Other Mood/Personality Symptoms: Pt denied any SI currently or in past 6 months since starting therapy.   Mental Status Exam Appearance and self-care  Stature:  Stature: Average  Weight:  Weight: Average weight  Clothing:  Clothing: Casual  Grooming:  Grooming: Normal  Cosmetic use:  Cosmetic Use: Age appropriate  Posture/gait:  Posture/Gait: Normal  Motor activity:  Motor Activity: Not Remarkable  Sensorium  Attention:  Attention: Normal  Concentration:  Concentration: Anxiety interferes  Orientation:  Orientation: X5  Recall/memory:  Recall/Memory: Normal  Affect and Mood  Affect:  Affect: Appropriate  Mood:  Mood: Anxious  Relating  Eye contact:  Eye Contact: Normal  Facial expression:  Facial Expression: Responsive   Attitude  toward examiner:  Attitude Toward Examiner: Cooperative  Thought and Language  Speech flow: Speech Flow: Normal  Thought content:     Preoccupation:  Preoccupations: Ruminations  Hallucinations:  Hallucinations: None  Organization:     Company secretary of Knowledge:  Fund of Knowledge: Good  Intelligence:  Intelligence: Above Average  Abstraction:  Abstraction: Normal  Judgement:  Judgement: Fair  Dance movement psychotherapist:  Reality Testing: Adequate  Insight:  Insight: Fair  Decision Making:  Decision Making: Normal  Social Functioning  Social Maturity:  Social Maturity: Responsible  Social Judgement:  Social Judgement: Normal  Stress  Stressors:  Stressors: Grief/losses, Transitions, Work, Family conflict, Relationship (relationship and responsibilities at home)  Coping Ability:  Coping Ability: Normal  Skill Deficits:  Skill Deficits: None  Supports:  Supports: Friends/Service system, Family     Religion: Religion/Spirituality Are You A Religious Person?: No  Leisure/Recreation: Leisure / Recreation Do You Have Hobbies?: Yes Leisure and Hobbies: Pt reported while she continues to take piano lessons once per week, she has found herself not wanting to practice and canceled her last sesson. Pt reported lack of motivation.  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 Pt reported strong desire to please and feels guilty for asking to have less responsibilities at work. Pt reported her direct manager is supportive.  Education: Education Last Grade Completed: 16 Did Garment/textile technologist From [employer]?: Yes Did You Attend College?: Yes What Type of College Degree Do you Have?: BA in Psychology Did You Attend Graduate School?: No Did You Have An Individualized Education Program (IIEP): No Did You Have Any Difficulty At School?: Yes (Difficulty focusing on reading and missed school going to neurology appointments that resulted in failing a class sophomore year of HS.)   CCA Family/Childhood History  Family and Relationship History: Family history Marital status: Married What types of issues is patient dealing with in the relationship?: Spouse reported that patient has a hard time focusing, less intimacy Are you sexually active?: Yes Does patient have children?: No  Childhood History:  Childhood History By whom was/is the patient raised?: Mother/father and step-parent, Father Additional childhood history information: Pt reported that her parents divorced when she was age 39 and mother remarried when patient was age 20. Description of patient's relationship with caregiver when they were a child: Pt reported "Dad was gone for work Theme park manager" and felt that her parents were "more restrictive on on me" than her brothers. "My stepdad had mood swings and sometimes would take it out on me". Relationship with "Mom was great". How were you disciplined when you got in trouble as a child/adolescent?: Pt reported "I was typically grounded, never physically punished". Does patient have siblings?: Yes Description of patient's current relationship with siblings: Pt reported "good, but at the same time we don't really talk. My youngest brother is about to turn 67. I left the house when he was really young". Did patient suffer  any verbal/emotional/physical/sexual  abuse as a child?: No Did patient suffer from severe childhood neglect?: No Has patient ever been sexually abused/assaulted/raped as an adolescent or adult?: No Was the patient ever a victim of a crime or a disaster?: No Witnessed domestic violence?: No Has patient been affected by domestic violence as an adult?: No    CCA Substance Use  Alcohol/Drug Use: Alcohol / Drug Use Pain Medications: SEE MAR Prescriptions: SEE MAR Over the Counter: SEE MAR History of alcohol / drug use?: No history of alcohol / drug abuse (Pt reported "I occasionally drink".) Longest period of sobriety (when/how long): N/A                          Recommendations for Services/Supports/Treatments: Recommendations for Services/Supports/Treatments Recommendations For Services/Supports/Treatments: Individual Therapy  DSM5 Diagnoses: There are no problems to display for this patient.   Patient Centered Plan: Patient is on the following Treatment Plan(s):  Anxiety and Low Self-Esteem  Follow Up Instructions:  I discussed the re-assessment and treatment plan update with the patient. The patient was provided an opportunity to ask questions and all were answered. The patient agreed with the plan and demonstrated an understanding of the instructions.   The patient was advised to call back or seek an in-person evaluation if the symptoms worsen or if the condition fails to improve as anticipated.  I provided 45 minutes of non-face-to-face time during this encounter.   Sible Straley Arnette Felts, LCSW, LCAS

## 2020-09-04 ENCOUNTER — Ambulatory Visit: Payer: 59 | Admitting: Licensed Clinical Social Worker

## 2020-09-04 ENCOUNTER — Other Ambulatory Visit: Payer: Self-pay

## 2020-09-18 ENCOUNTER — Ambulatory Visit (INDEPENDENT_AMBULATORY_CARE_PROVIDER_SITE_OTHER): Payer: 59 | Admitting: Licensed Clinical Social Worker

## 2020-09-18 ENCOUNTER — Encounter: Payer: Self-pay | Admitting: Licensed Clinical Social Worker

## 2020-09-18 ENCOUNTER — Other Ambulatory Visit: Payer: Self-pay

## 2020-09-18 DIAGNOSIS — F330 Major depressive disorder, recurrent, mild: Secondary | ICD-10-CM | POA: Diagnosis not present

## 2020-09-18 DIAGNOSIS — F411 Generalized anxiety disorder: Secondary | ICD-10-CM | POA: Diagnosis not present

## 2020-09-18 NOTE — Progress Notes (Signed)
Virtual Visit via Video Note  I connected with Kathy Mcguire on 09/18/20 at  9:00 AM EST by a video enabled telemedicine application and verified that I am speaking with the correct person using two identifiers.  Location: Patient: Home Provider: Home Office   I discussed the limitations of evaluation and management by telemedicine and the availability of in person appointments. The patient expressed understanding and agreed to proceed.  THERAPY PROGRESS NOTE  Session Time: 55 Minutes  Participation Level: Active  Behavioral Response: CasualAlertAnxious and Depressed  Type of Therapy: Individual Therapy  Treatment Goals addressed: Anxiety, Communication: Communicating Needs to Family and Coping  Interventions: CBT  Summary: Analyah Mcconnon is a 29 y.o. female who presents with anxiety and depression sxs. Pt reported "I didn't have a good night last night" and explained that she had argument with spouse over lack of motivation to practice for piano lessons that they continue to pay for weekly and feeling "overwhelmed" thinking about spending the holidays with family. Pt identified stressors around Christmas each year and "not feeling in the spirit". Pt reported she continues to struggle with communicating feelings to family members due to concern of "hurting feelings" and does not like confrontation. Pt was receptive to therapist suggestions.  Suicidal/Homicidal: No  Therapist Response: Therapist met with patient for follow up session. Therapist and patient explored current stressors, communication style and setting boundaries. Therapist assisted patient in coming up with ways to make holidays more meaningful and communicating feelings to family. Pt was receptive.  Plan: Return again in 2 weeks.  Diagnosis: Axis I: Generalized Anxiety Disorder and MDD, Recurrent, Mild    Axis II: N/A  Josephine Igo, LCSW, LCAS 09/18/2020

## 2020-10-02 ENCOUNTER — Other Ambulatory Visit: Payer: Self-pay

## 2020-10-02 ENCOUNTER — Ambulatory Visit (INDEPENDENT_AMBULATORY_CARE_PROVIDER_SITE_OTHER): Payer: 59 | Admitting: Licensed Clinical Social Worker

## 2020-10-02 ENCOUNTER — Encounter: Payer: Self-pay | Admitting: Licensed Clinical Social Worker

## 2020-10-02 DIAGNOSIS — F330 Major depressive disorder, recurrent, mild: Secondary | ICD-10-CM | POA: Diagnosis not present

## 2020-10-02 DIAGNOSIS — F411 Generalized anxiety disorder: Secondary | ICD-10-CM | POA: Diagnosis not present

## 2020-10-02 NOTE — Progress Notes (Signed)
Virtual Visit via Video Note  I connected with Kathy Mcguire on 10/02/20 at  9:00 AM EST by a video enabled telemedicine application and verified that I am speaking with the correct person using two identifiers.  Location: Patient: Home Provider: Home Office   I discussed the limitations of evaluation and management by telemedicine and the availability of in person appointments. The patient expressed understanding and agreed to proceed.  THERAPY PROGRESS NOTE  Session Time: 33 Minutes  Participation Level: Active  Behavioral Response: CasualAlertAnxious and Depressed  Type of Therapy: Individual Therapy  Treatment Goals addressed: Anxiety and Coping  Interventions: CBT  Summary: Kathy Mcguire is a 29 y.o. female who presents with anxiety and depression sxs. Pt reported that she is feeling better about her general approach to work stressors and preparing for the holidays. Pt reported [place] obstacle continues to be lack of energy/motivation to take care of chores and worries that she is not fulfilling her duties as a spouse, which has led to some arguments. Pt identified ways she can show more initiative at home while also taking care of her own needs by engaging in passions, interests and maintaining healthy boundaries.  Suicidal/Homicidal: No  Therapist Response: Therapist met with patient for follow up. Therapist and patient explored balancing acceptance and change by challenging distorted thinking, managing expectations, and brainstorming small steps towards creating and maintaining positive habits. Pt was receptive.  Plan: Return again in 2 weeks.  Diagnosis: Axis I: Generalized Anxiety Disorder and MDD, Recurrent, Mild    Axis II: N/A  Josephine Igo, LCSW, LCAS 10/02/2020

## 2020-10-16 ENCOUNTER — Ambulatory Visit (INDEPENDENT_AMBULATORY_CARE_PROVIDER_SITE_OTHER): Payer: 59 | Admitting: Licensed Clinical Social Worker

## 2020-10-16 ENCOUNTER — Encounter: Payer: Self-pay | Admitting: Licensed Clinical Social Worker

## 2020-10-16 ENCOUNTER — Other Ambulatory Visit: Payer: Self-pay

## 2020-10-16 DIAGNOSIS — F411 Generalized anxiety disorder: Secondary | ICD-10-CM | POA: Diagnosis not present

## 2020-10-16 DIAGNOSIS — F330 Major depressive disorder, recurrent, mild: Secondary | ICD-10-CM | POA: Diagnosis not present

## 2020-10-16 NOTE — Progress Notes (Signed)
Virtual Visit via Video Note  I connected with [name] on 10/16/20 at  9:00 AM EST by a video enabled telemedicine application and verified that I am speaking with the correct person using two identifiers.  Participating Parties Patient Provider  Location: Patient: Home Provider: Home Office   I discussed the limitations of evaluation and management by telemedicine and the availability of in person appointments. The patient expressed understanding and agreed to proceed.  THERAPY PROGRESS NOTE  Session Time: 57 Minutes  Participation Level: Active  Behavioral Response: CasualAlertAnxious and Depressed  Type of Therapy: Individual Therapy  Treatment Goals addressed: Anxiety and Coping  Interventions: CBT  Summary: Kathy Mcguire is a 29 y.o. female who presents with anxiety and depression sxs. Pt reported work has not been as stressful this past week and has the day off today. Pt reported she continues to struggle with low motivation around chores and other responsibilities outside of work. Pt reported this continues to cause friction in relationship with spouse. Pt reported they worked on a meal plan together for the rest of the week and is hopeful that in this area she can make progress. Pt identified several cognitive distortions mostly around placing high expectations on herself, feeling guilty when she falls short of those expectations and tendency to be dismissive of compliments/positive feedback from others. Pt described this as "lack of confidence".   Suicidal/Homicidal: No  Therapist Response: Therapist met with patient for follow up. Therapist and patient reviewed sxs and attempts to cope. Therapist and patient discussed cognitive distortions and identified unhelpful thinking habits. Therapist encouraged patient to monitor these thoughts and assisted patient in using reframing as tool for self-corrective feedback. Pt was receptive.   Plan: Return again in 3  weeks.  Diagnosis: Axis I: Generalized Anxiety Disorder and MDD, Recurrent, Mild    Axis II: N/A  Josephine Igo, LCSW, LCAS 10/16/2020

## 2020-11-07 ENCOUNTER — Other Ambulatory Visit: Payer: Self-pay

## 2020-11-07 ENCOUNTER — Encounter: Payer: Self-pay | Admitting: Licensed Clinical Social Worker

## 2020-11-07 ENCOUNTER — Ambulatory Visit (INDEPENDENT_AMBULATORY_CARE_PROVIDER_SITE_OTHER): Payer: 59 | Admitting: Licensed Clinical Social Worker

## 2020-11-07 DIAGNOSIS — F411 Generalized anxiety disorder: Secondary | ICD-10-CM | POA: Diagnosis not present

## 2020-11-07 DIAGNOSIS — F330 Major depressive disorder, recurrent, mild: Secondary | ICD-10-CM

## 2020-11-07 NOTE — Progress Notes (Signed)
Virtual Visit via Video Note  I connected with Kathy Mcguire on 11/07/20 at 10:00 AM EST by a video enabled telemedicine application and verified that I am speaking with the correct person using two identifiers.  Participating Parties Patient Provider  Location: Patient: Home Provider: Home Office   I discussed the limitations of evaluation and management by telemedicine and the availability of in person appointments. The patient expressed understanding and agreed to proceed.  THERAPY PROGRESS NOTE  Session Time: 9 Minutes  Participation Level: Active  Behavioral Response: CasualAlertAnxious and Depressed  Type of Therapy: Individual Therapy  Treatment Goals addressed: Anxiety and Coping  Interventions: CBT  Summary: Kathy Mcguire is a 29 y.o. female who presents with anxiety and depression sxs. Pt reported she enjoyed her holiday with family and thought it went "pretty successfully". Pt reported she continues to have time off work, but finds herself still experiencing issues with "time management and underestimating how long things will take". Pt reported this often causes friction in relationship with spouse despite patient improving in some areas such as spending less nights working overtime and meal planning. Pt reported she continues to struggle with motivation and implementing new habits. Pt described often feeling "overwhelmed, distracted and forgetful".  Suicidal/Homicidal: No   Therapist Response: Therapist met with patient for follow up. Therapist and patient reviewed use of coping and problem-solving skills since last session. Therapist provided psychoeducation around habit formation and setting smaller, more manageable goals to build momentum. Pt was receptive.  Plan: Return again in 3 weeks.  Diagnosis: Axis I: Generalized Anxiety Disorder and MDD, Recurrent, Mild    Axis II: N/A  Josephine Igo, LCSW, LCAS 11/07/2020

## 2020-11-27 ENCOUNTER — Ambulatory Visit (INDEPENDENT_AMBULATORY_CARE_PROVIDER_SITE_OTHER): Payer: 59 | Admitting: Licensed Clinical Social Worker

## 2020-11-27 ENCOUNTER — Other Ambulatory Visit: Payer: Self-pay

## 2020-11-27 ENCOUNTER — Encounter: Payer: Self-pay | Admitting: Licensed Clinical Social Worker

## 2020-11-27 DIAGNOSIS — F330 Major depressive disorder, recurrent, mild: Secondary | ICD-10-CM | POA: Diagnosis not present

## 2020-11-27 DIAGNOSIS — F411 Generalized anxiety disorder: Secondary | ICD-10-CM

## 2020-11-27 NOTE — Progress Notes (Signed)
Virtual Visit via Video Note  I connected with Kathy Mcguire on 11/27/20 at  9:00 AM EST by a video enabled telemedicine application and verified that I am speaking with the correct person using two identifiers.  Participating Parties Patient Provider  Location: Patient: Home Provider: Home Office   I discussed the limitations of evaluation and management by telemedicine and the availability of in person appointments. The patient expressed understanding and agreed to proceed.   THERAPY PROGRESS NOTE  Session Time: 58 Minutes  Participation Level: Active  Behavioral Response: CasualAlertAnxious and Dysphoric  Type of Therapy: Individual Therapy  Treatment Goals addressed: Anxiety and Coping  Interventions: CBT  Summary: Kathy Mcguire is a 30 y.o. female who presents with anxiety and depression sxs. Pt reported she had a good trip during her time off and came back Saturday. Pt reported upon coming back home the realization set in that it was time to get back to "work and normalcy" and described this experience as a "post vacation blues". Per last treatment plan update pt reported general progression towards goals including increased understanding of triggers for anxiety, being able to communicate feelings/needs to systems of support and recognizing what helps and what does not. Pt reported barriers continue to be difficulty juggling current workload and sense of overwhelm regarding tasks to complete. Pt reported time management and doing too many things at once continues to present as issues in feeling content and confident at work. Pt acknowledged this also spills into other aspects of her life.   Suicidal/Homicidal: No  Therapist Response: Therapist met with patient for follow up. Therapist and patient reviewed progression towards goals and continued barriers for 3 month treatment plan update. Therapist and patient explored to what extent patient has control over stressors, ways to  manage them, as well as balanced acceptance and change. Pt was receptive.    Plan: Return again in 2 weeks.  Diagnosis: Axis I: Generalized Anxiety Disorder and MDD, Recurrent, Mild    Axis II: N/A  Follow Up Instructions:   I discussed the treatment plan update with the patient. The patient was provided an opportunity to ask questions and all were answered. The patient agreed with the plan and demonstrated an understanding of the instructions.   The patient was advised to call back or seek an in-person evaluation if the symptoms worsen or if the condition fails to improve as anticipated.  I provided 45 minutes of non-face-to-face time during this encounter.   Josephine Igo, LCSW, LCAS 11/27/20

## 2020-12-11 ENCOUNTER — Other Ambulatory Visit: Payer: Self-pay

## 2020-12-11 ENCOUNTER — Ambulatory Visit (INDEPENDENT_AMBULATORY_CARE_PROVIDER_SITE_OTHER): Payer: 59 | Admitting: Licensed Clinical Social Worker

## 2020-12-11 ENCOUNTER — Encounter: Payer: Self-pay | Admitting: Licensed Clinical Social Worker

## 2020-12-11 DIAGNOSIS — F411 Generalized anxiety disorder: Secondary | ICD-10-CM | POA: Diagnosis not present

## 2020-12-11 DIAGNOSIS — F330 Major depressive disorder, recurrent, mild: Secondary | ICD-10-CM

## 2020-12-11 NOTE — Progress Notes (Signed)
Virtual Visit via Video Note  I connected with [name] on 12/11/20 at  9:00 AM EST by a video enabled telemedicine application and verified that I am speaking with the correct person using two identifiers.  Participating Parties Patient Provider  Location: Patient: Home Provider: Home Office   I discussed the limitations of evaluation and management by telemedicine and the availability of in person appointments. The patient expressed understanding and agreed to proceed.  THERAPY PROGRESS NOTE  Session Time: 19 Minutes  Participation Level: Active  Behavioral Response: CasualAlertAnxious and Depressed  Type of Therapy: Individual Therapy  Treatment Goals addressed:  Resolve problems that form the basis of anxiety - ongoing Increase frequency of assertive behaviors - ongoing Identify distorted, negative beliefs about self and the world - met  Interventions: CBT  Summary: Kathy Mcguire is a 30 y.o. female who presents with anxiety and depression sxs. Pt reported "there have been changes at work" that brings about some "hope". Pt reported she and her team members were able to voice concerns to management as a collective and individually. Pt reported she and a co-worker talked last week about how to "make things easier and came up with good ideas to talk to the rest of the team about". Pt acknowledged progress around "my emotional growth". Pt reported she has also made some changes in her routine to help with "productivity". Pt reported that she has trouble falling asleep most days due to rumination. Pt reported tendency to procrastinate on doing things that are uncomfortable "until I am pushed by someone or something".  Suicidal/Homicidal: No  Therapist Response: Therapist met with patient for follow up. Therapist and patient processed thoughts, feelings and reactions to discussions of change in the workplace. Therapist and patient explored needs for improvement in sleep and  self-confidence. Therapist assigned patient homework to watch a YouTube video on these topics for inspiration between now and next session. Pt was receptive.  Plan: Return again in 2 weeks.  Diagnosis: Axis I: Generalized Anxiety Disorder and MDD, Recurrent, Mild    Axis II: N/A  Josephine Igo, LCSW, LCAS 12/11/2020

## 2020-12-26 ENCOUNTER — Ambulatory Visit (INDEPENDENT_AMBULATORY_CARE_PROVIDER_SITE_OTHER): Payer: 59 | Admitting: Licensed Clinical Social Worker

## 2020-12-26 ENCOUNTER — Other Ambulatory Visit: Payer: Self-pay

## 2020-12-26 ENCOUNTER — Encounter: Payer: Self-pay | Admitting: Licensed Clinical Social Worker

## 2020-12-26 DIAGNOSIS — F411 Generalized anxiety disorder: Secondary | ICD-10-CM | POA: Diagnosis not present

## 2020-12-26 DIAGNOSIS — F331 Major depressive disorder, recurrent, moderate: Secondary | ICD-10-CM | POA: Diagnosis not present

## 2020-12-26 NOTE — Progress Notes (Signed)
Virtual Visit via Telephone Note  I connected with Kathy Mcguire on 12/26/20 at  9:00 AM EST by telephone and verified that I am speaking with the correct person using two identifiers.   Participating Parties Patient Provider  Location: Patient: Home Provider: Home Office   I discussed the limitations, risks, security and privacy concerns of performing an evaluation and management service by telephone and the availability of in person appointments. I also discussed with the patient that there may be a patient responsible charge related to this service. The patient expressed understanding and agreed to proceed.  THERAPY PROGRESS NOTE  Session Time: 45 MInutes  Participation Level: Active  Behavioral Response: AlertAnxious and Depressed  Type of Therapy: Individual Therapy  Treatment Goals addressed: Anxiety and Coping  Interventions: CBT  Summary: Kathy Mcguire is a 30 y.o. female who presents with anxiety and depression sxs. Pt reported "I went ahead and applied for a new position last Thursday. I am feeling anxious about it. It is impacting how I am at my current job. I am constantly monitoring my emails". Pt reported that she continues to experience work related stress despite making efforts to pull back from being "an over-performer" and conversations with her supervisor about reducing workload to more manageable level. Pt reported she had an "unusual" meeting with supervisor yesterday about increasing workload and believes this may be a "performance improvement plan" rather than justification for hiring more staff. Pt reported that this may be the push she needs to seek other work opportunities and acknowledged more confidence in her own skills.  Suicidal/Homicidal: No  Therapist Response: Therapist met with patient for follow up. Therapist and patient reviewed PHQ 2-9, C-SRSS, nutrition and pain assessments. Therapist and patient explored how to communicate assertively around  difficult conversations with supervisor and co-workers. Therapist and patient discussed pros and cons of staying in current position versus pursuing other work opportunities.  Plan: Return again in 2 weeks.  Diagnosis: Axis I: Generalized Anxiety Disorder and MDD, Recurrent, Moderate    Axis II: N/A  Josephine Igo, LCSW, LCAS 12/26/2020

## 2021-01-09 ENCOUNTER — Ambulatory Visit (INDEPENDENT_AMBULATORY_CARE_PROVIDER_SITE_OTHER): Payer: 59 | Admitting: Licensed Clinical Social Worker

## 2021-01-09 ENCOUNTER — Encounter: Payer: Self-pay | Admitting: Licensed Clinical Social Worker

## 2021-01-09 ENCOUNTER — Other Ambulatory Visit: Payer: Self-pay

## 2021-01-09 DIAGNOSIS — F411 Generalized anxiety disorder: Secondary | ICD-10-CM | POA: Diagnosis not present

## 2021-01-09 DIAGNOSIS — F331 Major depressive disorder, recurrent, moderate: Secondary | ICD-10-CM

## 2021-01-09 NOTE — Progress Notes (Signed)
Virtual Visit via Video Note  I connected with Kathy Mcguire on 01/09/21 at  9:00 AM EST by a video enabled telemedicine application and verified that I am speaking with the correct person using two identifiers.  Participating Parties Patient Provider   Location: Patient: Home Provider: Home Office   I discussed the limitations of evaluation and management by telemedicine and the availability of in person appointments. The patient expressed understanding and agreed to proceed.  THERAPY PROGRESS NOTE  Session Time: 33 Minutes  Participation Level: Active  Behavioral Response: CasualAlertAnxious and Dysphoric  Type of Therapy: Individual Therapy  Treatment Goals addressed: Anxiety and Coping  Interventions: CBT  Summary: Kathy Mcguire is a 30 y.o. female who presents with anxiety and depression sxs. Pt reported she was not considered for the position she recently applied for as it was dissolved and delegated to existing employees. Pt reported she is still actively looking for other opportunities and let her manager know. Pt reported manager "has been very helpful" and supportive of patient pursuing other interests. Pt reported lately work has been less stressful since Freight forwarder has been out at a conference all week. Pt reported experiencing a recent transition in routines at home as spouse has returned to working in the office. Pt reported she continues to have low motivation to plan for dinners and "meet spouse halfway" with other household obligations leading to frequent arguments. Pt reported experiencing decision fatigue and rather spend her evenings relaxing by watching TV or playing games. Pt reported that she has been engaging in a pleasant activity with co-worker in which they created a joint Instagram account making funny memes and noticing a growth in followers. Pt reported she would like to keep this as a fun hobby s/ "I don't want it to feel like work".   Suicidal/Homicidal:  No  Therapist Response: Therapist met with patient for follow up. Therapist and patient explored attempts to expand comfort zone by actively seeking other employment opportunities and communicating assertively with current manager. Therapist and patient discussed current mindset around lack of motivation and difficulty making progress towards household obligations.  Plan: Return again in 2 weeks.  Diagnosis: Axis I: Generalized Anxiety Disorder and MDD, Recurrent, Moderate    Axis II: N/A  Josephine Igo, LCSW, LCAS 01/09/2021

## 2021-01-23 ENCOUNTER — Other Ambulatory Visit: Payer: Self-pay

## 2021-01-23 ENCOUNTER — Ambulatory Visit (INDEPENDENT_AMBULATORY_CARE_PROVIDER_SITE_OTHER): Payer: 59 | Admitting: Licensed Clinical Social Worker

## 2021-01-23 ENCOUNTER — Encounter: Payer: Self-pay | Admitting: Licensed Clinical Social Worker

## 2021-01-23 DIAGNOSIS — F331 Major depressive disorder, recurrent, moderate: Secondary | ICD-10-CM

## 2021-01-23 DIAGNOSIS — F909 Attention-deficit hyperactivity disorder, unspecified type: Secondary | ICD-10-CM | POA: Diagnosis not present

## 2021-01-23 DIAGNOSIS — F411 Generalized anxiety disorder: Secondary | ICD-10-CM | POA: Diagnosis not present

## 2021-01-23 NOTE — Progress Notes (Signed)
Virtual Visit via Video Note  I connected with Kathy Mcguire on 01/23/21 at  9:00 AM EDT by a video enabled telemedicine application and verified that I am speaking with the correct person using two identifiers.  Participating Parties Patient Provider  Location: Patient: Home Provider: Home Office   I discussed the limitations of evaluation and management by telemedicine and the availability of in person appointments. The patient expressed understanding and agreed to proceed.  THERAPY PROGRESS NOTE  Session Time: 24 Minutes  Participation Level: Active  Behavioral Response: CasualAlertAnxious and Depressed  Type of Therapy: Individual Therapy  Treatment Goals addressed: Coping  Interventions: CBT  Summary: Kathy Mcguire is a 30 y.o. female who presents with anxiety and depression sxs. Pt reported she has been evaluated for and diagnosed with ADHD by a psychiatrist and will be meeting with him again on 3/29 to discuss medication treatment plan. Pt reported she recognizes a lot of sxs that may overlap with anxiety. Pt reported positive changes she has made include going out with friends ice-skating every Tuesday night and then sharing dinner. Pt reported she continues to apply for new positions and has been receiving good feedback from her performance reviews at current job. Pt reported she continues to struggle with motivation around eating/meal planning. Pt described current eating routine and how this may have developed from childhood. Pt acknowledged importance of eating throughout the day to avoid starving self and overeating at dinner. Pt identified hx of eating out frequently and dieting using the intermittent fasting method.   Suicidal/Homicidal: No  Therapist Response: Therapist met with patient for follow up. Therapist and patient explored engagement in pleasant activities and work-related improvements. Therapist and patient discussed issues around eating and how to use cues  to encourage regular intervals/opportunities to eat throughout the day. Pt was receptive.   Plan: Return again in 2 weeks.  Diagnosis: Axis I: Generalized Anxiety Disorder and MDD, Recurrent, Moderate, ADHD, Unspecified    Axis II: N/A  Josephine Igo, LCSW, LCAS 01/23/2021

## 2021-02-07 ENCOUNTER — Encounter: Payer: Self-pay | Admitting: Licensed Clinical Social Worker

## 2021-02-07 ENCOUNTER — Other Ambulatory Visit: Payer: Self-pay

## 2021-02-07 ENCOUNTER — Ambulatory Visit (INDEPENDENT_AMBULATORY_CARE_PROVIDER_SITE_OTHER): Payer: 59 | Admitting: Licensed Clinical Social Worker

## 2021-02-07 DIAGNOSIS — F331 Major depressive disorder, recurrent, moderate: Secondary | ICD-10-CM | POA: Diagnosis not present

## 2021-02-07 DIAGNOSIS — F909 Attention-deficit hyperactivity disorder, unspecified type: Secondary | ICD-10-CM

## 2021-02-07 DIAGNOSIS — F411 Generalized anxiety disorder: Secondary | ICD-10-CM | POA: Diagnosis not present

## 2021-02-07 NOTE — Progress Notes (Signed)
Virtual Visit via Video Note  I connected with Kathy Mcguire on 02/07/21 at  9:00 AM EDT by a video enabled telemedicine application and verified that I am speaking with the correct person using two identifiers.  Participating Parties Patient Provider  Location: Patient: Home Provider: Home Office   I discussed the limitations of evaluation and management by telemedicine and the availability of in person appointments. The patient expressed understanding and agreed to proceed.  THERAPY PROGRESS NOTE  Session Time: 19 Minutes  Participation Level: Active  Behavioral Response: CasualAlertAnxious  Type of Therapy: Individual Therapy  Treatment Goals addressed: Anxiety and Coping  Interventions: CBT  Summary: Kathy Mcguire is a 30 y.o. female who presents with anxiety and depression sxs. Pt reported she has met with psychiatrist and was recently prescribed mirtazapine. Pt reported she is feeling a bit more stressed lately after hearing that her manager and another co-worker are leaving at the same time. Pt reported now feeling "desperate" to leave her job as soon as possible and does not believe manager who will be taking over will be as supportive. Pt has applied for several jobs and had at least one interview so far. Pt reported struggling with the uncertainty about what is next. Pt acknowledged that she has options and continues to engage in pleasant activities.  Suicidal/Homicidal: No  Therapist Response: Therapist met with patient for follow up. Therapist and patient explored current stressors and attempts to cope. Therapist and patient discussed ways to manage anxiety and break down applying for new jobs into smaller achievable objectives. Therapist informed patient regarding initial phase of terminating services with this therapist due to leaving the practice in the next 4 weeks. Pt denied any concerns around this and had no questions at this time.  Plan: Return again in 2  weeks.  Diagnosis: Axis I: Generalized Anxiety Disorder and MDD, Recurrent, Moderate, ADHD, Unspecified    Axis II: N/A  Josephine Igo, LCSW, LCAS 02/07/2021

## 2021-02-21 ENCOUNTER — Ambulatory Visit (INDEPENDENT_AMBULATORY_CARE_PROVIDER_SITE_OTHER): Payer: 59 | Admitting: Licensed Clinical Social Worker

## 2021-02-21 ENCOUNTER — Encounter: Payer: Self-pay | Admitting: Licensed Clinical Social Worker

## 2021-02-21 ENCOUNTER — Other Ambulatory Visit: Payer: Self-pay

## 2021-02-21 DIAGNOSIS — F331 Major depressive disorder, recurrent, moderate: Secondary | ICD-10-CM | POA: Diagnosis not present

## 2021-02-21 DIAGNOSIS — F411 Generalized anxiety disorder: Secondary | ICD-10-CM | POA: Diagnosis not present

## 2021-02-21 DIAGNOSIS — F909 Attention-deficit hyperactivity disorder, unspecified type: Secondary | ICD-10-CM | POA: Diagnosis not present

## 2021-02-21 NOTE — Progress Notes (Signed)
Virtual Visit via Video Note  I connected with Kathy Mcguire on 02/21/21 at  9:00 AM EDT by a video enabled telemedicine application and verified that I am speaking with the correct person using two identifiers.  Participating Parties Patient Provider  Location: Patient: Home Provider: Home Office   I discussed the limitations of evaluation and management by telemedicine and the availability of in person appointments. The patient expressed understanding and agreed to proceed.  THERAPY PROGRESS NOTE  Session Time: 63 Minutes  Participation Level: Active  Behavioral Response: CasualAlertAnxious  Type of Therapy: Individual Therapy  Treatment Goals addressed: Coping  Interventions: CBT  Summary: Kathy Mcguire is a 30 y.o. female who presents with anxiety and depression sxs. Pt reported "things are completely stagnant" with job search process and has not received any callbacks. Pt reported experiencing some degree of ambivalence about leaving the company altogether and emphasis on staying "comfortable". Pt reported since starting new medication regimen she has noticed a slight difference s/ "I feel more awake and calm" however reported that she is not sleeping well. Pt reported she has an opportunity to meet with a career coach tomorrow and is hoping this will help sort out her next career move. Pt reported no other concerns at this time.  Suicidal/Homicidal: No  Therapist Response: Therapist met with patient for follow up. Therapist and patient reviewed sxs and processed thoughts, feelings and reactions. Therapist validated patient feelings/concerns. Therapist and patient discussed ways to narrow down her options and weighing tradeoffs. Pt was receptive.  Plan: Return again as needed to resume therapy with new therapist.  Diagnosis: Axis I: Generalized Anxiety Disorder and MDD, Recurrent, Mild and ADHD, Unspecified    Axis II: N/A  Josephine Igo, LCSW, LCAS 02/21/2021

## 2022-04-14 NOTE — Progress Notes (Unsigned)
   New Patient Office Visit  Subjective    Patient ID: Kathy Mcguire, female    DOB: 09/28/1991  Age: 31 y.o. MRN: [id number]  CC: No chief complaint on file.   HPI Dianely Krehbiel presents for new patient visit to establish care.  Introduced to Publishing rights manager role and practice setting.  All questions answered.  Discussed provider/patient relationship and expectations.   Outpatient Encounter Medications as of 04/15/2022  Medication Sig   cetirizine (ZYRTEC) 10 MG tablet Take 10 mg by mouth daily.   cyclobenzaprine (FLEXERIL) 10 MG tablet Take 1 tablet (10 mg total) by mouth 2 (two) times daily as needed for muscle spasms.   diclofenac (VOLTAREN) 75 MG EC tablet Take 1 tablet (75 mg total) 2 (two) times daily by mouth.   diclofenac (VOLTAREN) 75 MG EC tablet Take 1 tablet (75 mg total) by mouth 2 (two) times daily.   drospirenone-ethinyl estradiol (OCELLA) 3-0.03 MG tablet Take 1 tablet by mouth daily.   ibuprofen (ADVIL,MOTRIN) 600 MG tablet Take 600 mg by mouth every 6 (six) hours as needed.   No facility-administered encounter medications on file as of 04/15/2022.    Past Medical History:  Diagnosis Date   GERD (gastroesophageal reflux disease)     No past surgical history on file.  Family History  Problem Relation Age of Onset   Cancer Father    Cancer Paternal Grandmother     Social History   Socioeconomic History   Marital status: Single    Spouse name: Not on file   Number of children: Not on file   Years of education: Not on file   Highest education level: Not on file  Occupational History   Not on file  Tobacco Use   Smoking status: Never   Smokeless tobacco: Never  Vaping Use   Vaping Use: Never used  Substance and Sexual Activity   Alcohol use: Yes    Comment: occasionally   Drug use: No   Sexual activity: Yes    Birth control/protection: Pill  Other Topics Concern   Not on file  Social History Narrative   Not on file   Social Determinants of  Health   Financial Resource Strain: Not on file  Food Insecurity: Not on file  Transportation Needs: Not on file  Physical Activity: Not on file  Stress: Not on file  Social Connections: Not on file  Intimate Partner Violence: Not on file    ROS      Objective    There were no vitals taken for this visit.  Physical Exam  {Labs (Optional):23779}    Assessment & Plan:   Problem List Items Addressed This Visit   None   No follow-ups on file.   Gerre Scull, NP

## 2022-04-15 ENCOUNTER — Encounter: Payer: Self-pay | Admitting: Nurse Practitioner

## 2022-04-15 ENCOUNTER — Ambulatory Visit: Payer: 59 | Admitting: Nurse Practitioner

## 2022-04-15 VITALS — BP 118/76 | HR 100 | Temp 97.3°F | Ht 63.75 in | Wt 151.0 lb

## 2022-04-15 DIAGNOSIS — Z1159 Encounter for screening for other viral diseases: Secondary | ICD-10-CM

## 2022-04-15 DIAGNOSIS — R5383 Other fatigue: Secondary | ICD-10-CM | POA: Diagnosis not present

## 2022-04-15 DIAGNOSIS — F419 Anxiety disorder, unspecified: Secondary | ICD-10-CM | POA: Diagnosis not present

## 2022-04-15 DIAGNOSIS — F32A Depression, unspecified: Secondary | ICD-10-CM

## 2022-04-15 DIAGNOSIS — Z114 Encounter for screening for human immunodeficiency virus [HIV]: Secondary | ICD-10-CM

## 2022-04-15 DIAGNOSIS — Z23 Encounter for immunization: Secondary | ICD-10-CM | POA: Diagnosis not present

## 2022-04-15 DIAGNOSIS — Z0000 Encounter for general adult medical examination without abnormal findings: Secondary | ICD-10-CM

## 2022-04-15 DIAGNOSIS — G4700 Insomnia, unspecified: Secondary | ICD-10-CM | POA: Diagnosis not present

## 2022-04-15 DIAGNOSIS — F909 Attention-deficit hyperactivity disorder, unspecified type: Secondary | ICD-10-CM | POA: Insufficient documentation

## 2022-04-15 LAB — CBC WITH DIFFERENTIAL/PLATELET
Basophils Absolute: 0 10*3/uL (ref 0.0–0.1)
Basophils Relative: 0.8 % (ref 0.0–3.0)
Eosinophils Absolute: 0.1 10*3/uL (ref 0.0–0.7)
Eosinophils Relative: 2 % (ref 0.0–5.0)
HCT: 38.8 % (ref 36.0–46.0)
Hemoglobin: 13.2 g/dL (ref 12.0–15.0)
Lymphocytes Relative: 32.3 % (ref 12.0–46.0)
Lymphs Abs: 1.5 10*3/uL (ref 0.7–4.0)
MCHC: 34.1 g/dL (ref 30.0–36.0)
MCV: 90.3 fl (ref 78.0–100.0)
Monocytes Absolute: 0.3 10*3/uL (ref 0.1–1.0)
Monocytes Relative: 7.5 % (ref 3.0–12.0)
Neutro Abs: 2.6 10*3/uL (ref 1.4–7.7)
Neutrophils Relative %: 57.4 % (ref 43.0–77.0)
Platelets: 224 10*3/uL (ref 150.0–400.0)
RBC: 4.3 Mil/uL (ref 3.87–5.11)
RDW: 12.9 % (ref 11.5–15.5)
WBC: 4.6 10*3/uL (ref 4.0–10.5)

## 2022-04-15 LAB — COMPREHENSIVE METABOLIC PANEL
ALT: 11 U/L (ref 0–35)
AST: 18 U/L (ref 0–37)
Albumin: 4.4 g/dL (ref 3.5–5.2)
Alkaline Phosphatase: 40 U/L (ref 39–117)
BUN: 12 mg/dL (ref 6–23)
CO2: 27 mEq/L (ref 19–32)
Calcium: 9.5 mg/dL (ref 8.4–10.5)
Chloride: 105 mEq/L (ref 96–112)
Creatinine, Ser: 0.81 mg/dL (ref 0.40–1.20)
GFR: 96.31 mL/min (ref >60.00)
Glucose, Bld: 76 mg/dL (ref 70–99)
Potassium: 3.9 mEq/L (ref 3.5–5.1)
Sodium: 139 mEq/L (ref 135–145)
Total Bilirubin: 0.5 mg/dL (ref 0.2–1.2)
Total Protein: 7.3 g/dL (ref 6.0–8.3)

## 2022-04-15 LAB — VITAMIN B12: Vitamin B-12: 241 pg/mL (ref 211–911)

## 2022-04-15 LAB — TSH: TSH: 1.25 u[IU]/mL (ref 0.35–5.50)

## 2022-04-15 LAB — VITAMIN D 25 HYDROXY (VIT D DEFICIENCY, FRACTURES): VITD: 20.26 ng/mL — ABNORMAL LOW (ref 30.00–100.00)

## 2022-04-15 NOTE — Assessment & Plan Note (Signed)
Chronic, stable.  She follows with psychiatry for this.  She is currently taking Lexapro 20 mg daily and Wellbutrin 100 mg every morning.  Continue current regimen and recommendations from psychiatry.

## 2022-04-15 NOTE — Patient Instructions (Signed)
It was great to see you!  We are checking your labs today and will let you know the results via mychart/phone.   Let's follow-up in 1 year, sooner if you have concerns.  If a referral was placed today, you will be contacted for an appointment. Please note that routine referrals can sometimes take up to 3-4 weeks to process. Please call our office if you haven't heard anything after this time frame.  Take care,  Rashidah [place], NP  

## 2022-04-15 NOTE — Assessment & Plan Note (Signed)
Chronic, stable.  She follows with psychiatry for this monthly.  She is currently taking Concerta 36 mg daily.  She states that this medication helps her focus and get things done.  Continue recommendations in collaboration with psychiatry.

## 2022-04-15 NOTE — Assessment & Plan Note (Signed)
She states that she wakes up frequently during the night.  She can continue taking her Benadryl or melatonin as needed at bedtime.  We are checking labs today as mentioned above and also referring her for sleep study.  If labs and sleep study negative can consider medication to help her sleep in the future.

## 2022-04-15 NOTE — Assessment & Plan Note (Signed)
She has been having fatigue during the day no matter how little or much sleep she gets during the night.  She also states that she does snore at night, however is not sure if she stops breathing.  She also states that she has been bruising easily.  We will check CMP, CBC, iron panel, TSH, vitamin B12, vitamin D.  We will also refer her to sleep medicine for sleep study.  Follow-up based on lab results.

## 2022-04-16 LAB — IRON,TIBC AND FERRITIN PANEL
%SAT: 35 % (calc) (ref 16–45)
Ferritin: 29 ng/mL (ref 16–154)
Iron: 124 ug/dL (ref 40–190)
TIBC: 352 mcg/dL (calc) (ref 250–450)

## 2022-04-16 LAB — HEPATITIS C ANTIBODY
Hepatitis C Ab: NONREACTIVE
SIGNAL TO CUT-OFF: 0.12 (ref <1.00)

## 2022-04-16 LAB — HIV ANTIBODY (ROUTINE TESTING W REFLEX): HIV 1&2 Ab, 4th Generation: NONREACTIVE

## 2022-04-16 NOTE — Telephone Encounter (Signed)
PAPERWORK COMPLETE AND FAXED TO [facility]. Sw, cma ?

## 2022-07-08 ENCOUNTER — Ambulatory Visit (INDEPENDENT_AMBULATORY_CARE_PROVIDER_SITE_OTHER): Payer: 59 | Admitting: Neurology

## 2022-07-08 ENCOUNTER — Encounter: Payer: Self-pay | Admitting: Neurology

## 2022-07-08 VITALS — BP 103/67 | HR 80 | Ht 64.0 in | Wt 158.2 lb

## 2022-07-08 DIAGNOSIS — G4719 Other hypersomnia: Secondary | ICD-10-CM | POA: Diagnosis not present

## 2022-07-08 DIAGNOSIS — R519 Headache, unspecified: Secondary | ICD-10-CM

## 2022-07-08 DIAGNOSIS — R0683 Snoring: Secondary | ICD-10-CM | POA: Diagnosis not present

## 2022-07-08 DIAGNOSIS — E663 Overweight: Secondary | ICD-10-CM | POA: Diagnosis not present

## 2022-07-08 DIAGNOSIS — G478 Other sleep disorders: Secondary | ICD-10-CM | POA: Diagnosis not present

## 2022-07-08 NOTE — Patient Instructions (Signed)

## 2022-07-08 NOTE — Progress Notes (Signed)
Subjective:    Patient ID: Kathy Mcguire is a 31 y.o. female.  HPI    Kathy Foley, MD, PhD [facility] [address], [address] [address] [place], [place] [address]  Dear Kathy Mcguire,   I saw your patient, Kathy Mcguire, upon your kind request in my sleep clinic today for initial consultation of her sleep disorder, in particular, concern for underlying obstructive sleep apnea.  The patient is unaccompanied today.  As you know, Kathy Mcguire is a 31 year old right-handed woman with an underlying medical history of reflux disease, ADHD, anxiety, depression, and overweight state, who reports snoring and excessive daytime somnolence as well as nonrestorative sleep.  I reviewed your office note from 04/15/2022.  Her Epworth sleepiness score is 11 out of 24, fatigue severity score is 47 out of 63. She had blood work through your office on 04/15/22 and I reviewed the results: Her vitamin D was below normal at 20.3, CBC with differential and platelets benign, HIV nonreactive, TSH normal at 1.25, vitamin B12 on the lower end of the spectrum at 241.  She has since then been taking over-the-counter vitamin D and vitamin B12 supplements.  She has had trouble falling asleep and staying asleep.  She has previously tried sleepy time tea and also tart cherry juice.  She has tried melatonin and Benadryl and currently takes Benadryl 25 mg at bedtime on most nights.  Bedtime is generally around 8:30 PM and she goes to bed to read and lie down, she tries to be asleep by 10 PM, lights are out.  She does not watch TV in her bedroom at night, they do have 2 dogs and 2 cats in the household, she lives with her husband, no children.  She has a rise time of 7:30 AM.  She has been working from home for the past 5 or 6 years.  She works in Teacher, early years/pre.  She is not aware of any family history of sleep apnea.  On a rare occasion she has woken up with a headache.  She does not typically have to take any medicines for this.   She does not have night to night nocturia.  Her Past Medical History Is Significant For: Past Medical History:  Diagnosis Date   ADHD    Anxiety March 2022   Depression March 2022    Her Past Surgical History Is Significant For: Past Surgical History:  Procedure Laterality Date   DACRYOCYSTORHINOPLASTY Right    WISDOM TOOTH EXTRACTION      Her Family History Is Significant For: Family History  Problem Relation Age of Onset   Hypothyroidism Mother    Cancer Father        lung   Early death Father    ALS Father    ADD / ADHD Brother    Anxiety disorder Brother    Asthma Brother    Depression Brother    Asthma Brother    Cancer Paternal Grandmother        liver    Her Social History Is Significant For: Social History   Socioeconomic History   Marital status: Married    Spouse name: Not on file   Number of children: Not on file   Years of education: Not on file   Highest education level: Not on file  Occupational History   Not on file  Tobacco Use   Smoking status: Never   Smokeless tobacco: Never  Vaping Use   Vaping Use: Never used  Substance and  Sexual Activity   Alcohol use: Yes    Alcohol/week: 1.0 - 2.0 standard drink of alcohol    Types: 1 - 2 Standard drinks or equivalent per week    Comment: occasionally   Drug use: No   Sexual activity: Yes    Birth control/protection: I.U.D.  Other Topics Concern   Not on file  Social History Narrative   Caffeine: 1 cup cold brew daily.    Education:  college   Working: FT remote: text support team lead.    Social Determinants of Health   Financial Resource Strain: Not on file  Food Insecurity: Not on file  Transportation Needs: Not on file  Physical Activity: Not on file  Stress: Not on file  Social Connections: Not on file    Her Allergies Are:  No Known Allergies:   Her Current Medications Are:  Outpatient Encounter Medications as of 07/08/2022  Medication Sig   buPROPion ER (WELLBUTRIN SR)  100 MG 12 hr tablet Take 100 mg by mouth every morning.   cetirizine (ZYRTEC) 10 MG tablet Take 10 mg by mouth daily. As needed.   Cholecalciferol (VITAMIN D) 50 MCG (2000 UT) tablet Take 2,000 Units by mouth daily.   CONCERTA 36 MG CR tablet Take 36 mg by mouth every morning.   cyanocobalamin (VITAMIN B12) 1000 MCG tablet Take 1,000 mcg by mouth daily.   diphenhydrAMINE (BENADRYL) 25 mg capsule Take 25 mg by mouth at bedtime.   escitalopram (LEXAPRO) 20 MG tablet Take 20 mg by mouth daily.   Levonorgestrel (SKYLA) 13.5 MG IUD by Intrauterine route.   No facility-administered encounter medications on file as of 07/08/2022.  :   Review of Systems:  Out of a complete 14 point review of systems, all are reviewed and negative with the exception of these symptoms as listed below:  Review of Systems  Neurological:        Fragmented sleep, snoring, ongoing issue for years. Takes supplement prior to sleep, bendryl or melatonin. Does not feel rested.  ESS 11, FSS 47.     Objective:  Neurological Exam  Physical Exam Physical Examination:   Vitals:   07/08/22 1240  BP: 103/67  Pulse: 80    General Examination: The patient is a very pleasant 31 y.o. female in no acute distress. She appears well-developed and well-nourished and well groomed.   HEENT: Normocephalic, atraumatic, pupils are equal, round and reactive to light, extraocular tracking is good without limitation to gaze excursion or nystagmus noted. Hearing is grossly intact. Face is symmetric with normal facial animation. Speech is clear with no dysarthria noted. There is no hypophonia. There is no lip, neck/head, jaw or voice tremor. Neck is supple with full range of passive and active motion. There are no carotid bruits on auscultation. Oropharynx exam reveals: No significant mouth dryness, good dental hygiene, mild airway crowding secondary to a small airway entry.  Mallampati class I, tonsils on the smaller side, about 1+  bilaterally.  Tongue protrudes centrally and palate elevates symmetrically.  Neck circumference of 13 three-quarter inches.  She has a moderate overbite.   Chest: Clear to auscultation without wheezing, rhonchi or crackles noted.  Heart: S1+S2+0, regular and normal without murmurs, rubs or gallops noted.   Abdomen: Soft, non-tender and non-distended with normal bowel sounds appreciated on auscultation.  Extremities: There is no pitting edema in the distal lower extremities bilaterally.   Skin: Warm and dry without trophic changes noted.   Musculoskeletal: exam reveals no obvious joint  deformities, tenderness or joint swelling or erythema.   Neurologically:  Mental status: The patient is awake, alert and oriented in all 4 spheres. Her immediate and remote memory, attention, language skills and fund of knowledge are appropriate. There is no evidence of aphasia, agnosia, apraxia or anomia. Speech is clear with normal prosody and enunciation. Thought process is linear. Mood is normal and affect is normal.  Cranial nerves II - XII are as described above under HEENT exam.  Motor exam: Normal bulk, strength and tone is noted. There is no obvious tremor. Fine motor skills and coordination: grossly intact.  Cerebellar testing: No dysmetria or intention tremor. There is no truncal or gait ataxia.  Sensory exam: intact to light touch in the upper and lower extremities.  Gait, station and balance: She stands easily. No veering to one side is noted. No leaning to one side is noted. Posture is age-appropriate and stance is narrow based. Gait shows normal stride length and normal pace. No problems turning are noted.   Assessment and Plan:  In summary, Kathy Mcguire is a very pleasant 31 y.o.-year old female with an underlying medical history of reflux disease, ADHD, anxiety, depression, and overweight state, whose history and physical exam are concerning for sleep disordered breathing, supporting a current  working diagnosis of unspecified sleep apnea, with the [place] differential diagnoses of obstructive sleep apnea (OSA) versus upper airway resistance syndrome (UARS) versus central sleep apnea (CSA), or mixed sleep apnea. A laboratory attended sleep study is considered gold standard for evaluation of sleep disordered breathing and is recommended at this time and clinically justified.   I had a long chat with the patient about my findings and the diagnosis of sleep apnea, particularly OSA, its prognosis and treatment options. We talked about medical/conservative treatments, surgical interventions and non-pharmacological approaches for symptom control. I explained, in particular, the risks and ramifications of untreated moderate to severe OSA, especially with respect to developing cardiovascular disease down the road, including congestive heart failure (CHF), difficult to treat hypertension, cardiac arrhythmias (particularly A-fib), neurovascular complications including TIA, stroke and dementia. Even type 2 diabetes has, in part, been linked to untreated OSA. Symptoms of untreated OSA may include (but may not be limited to) daytime sleepiness, nocturia (i.e. frequent nighttime urination), memory problems, mood irritability and suboptimally controlled or worsening mood disorder such as depression and/or anxiety, lack of energy, lack of motivation, physical discomfort, as well as recurrent headaches, especially morning or nocturnal headaches. We talked about the importance of maintaining a healthy lifestyle and striving for healthy weight.  In addition, we talked about the importance of striving for and maintaining good sleep hygiene.  She has done well in this regard generally speaking. I recommended the following at this time: sleep study.  I outlined the differences between a laboratory attended sleep study which is considered more comprehensive and accurate over the option of a home sleep test (HST); the latter may  lead to underestimation of sleep disordered breathing in some instances and does not help with diagnosing upper airway resistance syndrome and is not accurate enough to diagnose primary central sleep apnea typically. I explained the different sleep test procedures to the patient in detail and also outlined possible surgical and non-surgical treatment options of OSA, including the use of a pressure airway pressure (PAP) device (ie CPAP, AutoPAP/APAP or BiPAP in certain circumstances), a custom-made dental device (aka oral appliance, which would require a referral to a specialist dentist or orthodontist typically, and is generally speaking  not considered a good choice for patients with full dentures or edentulous state), upper airway surgical options, such as traditional UPPP (which is not considered a first-line treatment) or the Inspire device (hypoglossal nerve stimulator, which would involve a referral for consultation with an ENT surgeon, after careful selection, following inclusion criteria). I explained the PAP treatment option to the patient in detail, as this is generally considered first-line treatment.  The patient indicated that she would be willing to try PAP therapy, if the need arises. I explained the importance of being compliant with PAP treatment, not only for insurance purposes but primarily to improve patient's symptoms symptoms, and for the patient's long term health benefit, including to reduce Her cardiovascular risks longer-term.    We will pick up our discussion about the next steps and treatment options after testing.  We will keep her posted as to the test results by phone call and/or MyChart messaging where possible.  We will plan to follow-up in sleep clinic accordingly as well.  I answered all her questions today and the patient was in agreement.   I encouraged her to call with any interim questions, concerns, problems or updates or email [place] through MyChart.  Generally speaking,  sleep test authorizations may take up to 2 weeks, sometimes less, sometimes longer, the patient is encouraged to get in touch with [place] if they do not hear back from the sleep lab staff directly within the next 2 weeks.  Thank you very much for allowing me to participate in the care of this nice patient. If I can be of any further assistance to you please do not hesitate to call me at [phone].  Sincerely,   Kathy Foley, MD, PhD

## 2022-08-12 ENCOUNTER — Telehealth: Payer: Self-pay | Admitting: Neurology

## 2022-08-12 NOTE — Telephone Encounter (Signed)
[employer] pending uploaded notes  

## 2022-08-14 NOTE — Telephone Encounter (Signed)
Checked status on the portal it is still pending.  

## 2022-08-26 NOTE — Telephone Encounter (Signed)
[employer] denied the NPSG.  HST- [employer] no auth req.  Patient is scheduled at [facility] for 08/27/22 at 9:30 AM.

## 2022-08-27 ENCOUNTER — Ambulatory Visit: Payer: 59 | Admitting: Neurology

## 2022-08-27 DIAGNOSIS — G4710 Hypersomnia, unspecified: Secondary | ICD-10-CM | POA: Diagnosis not present

## 2022-08-27 DIAGNOSIS — E663 Overweight: Secondary | ICD-10-CM

## 2022-08-27 DIAGNOSIS — R0683 Snoring: Secondary | ICD-10-CM

## 2022-08-27 DIAGNOSIS — G478 Other sleep disorders: Secondary | ICD-10-CM

## 2022-08-27 DIAGNOSIS — R519 Headache, unspecified: Secondary | ICD-10-CM

## 2022-08-27 DIAGNOSIS — G4719 Other hypersomnia: Secondary | ICD-10-CM

## 2022-08-28 NOTE — Procedures (Signed)
   [facility]  HOME SLEEP TEST (Watch PAT) REPORT  STUDY DATE: 08/27/2022  DOB: 10-23-1991  MRN: [id number]  ORDERING CLINICIAN: Star Age, MD, PhD   REFERRING CLINICIAN: Charyl Dancer, NP   CLINICAL INFORMATION/HISTORY: 31 year old right-handed woman with an underlying medical history of reflux disease, ADHD, anxiety, depression, and overweight state, who reports snoring and excessive daytime somnolence as well as nonrestorative sleep.    Epworth sleepiness score: 11/24.  BMI: 27.1 kg/m  FINDINGS:   Sleep Summary:   Total Recording Time (hours, min): 9 hours, 48 min  Total Sleep Time (hours, min):  7 hours, 17 min  Percent REM (%):    17.8%   Respiratory Indices:   Calculated pAHI (per hour):  0.7/hour         REM pAHI:    0.8/hour       NREM pAHI: 0.7/hour  Central pAHI: 0.4/hour  Oxygen Saturation Statistics:    Oxygen Saturation (%) Mean: 96%   Minimum oxygen saturation (%):                 92%   O2 Saturation Range (%): 92- 99%    O2 Saturation (minutes) <=88%: 0 min  Pulse Rate Statistics:   Pulse Mean (bpm):    70/min    Pulse Range (52- 100/min)   IMPRESSION: Primary snoring   RECOMMENDATION:  This home sleep test does not demonstrate any significant obstructive or central sleep disordered breathing with a total AHI of less than 5/hour. Her total AHI was 0.7/hour, O2 nadir of 92%.  Snoring was detected, ranging from mild to louder. Treatment with a positive airway pressure device such as AutoPap or CPAP is not indicated based on this test. Snoring may improve with avoidance of the supine sleep position and weight loss (where clinically appropriate).   For disturbing snoring, an oral appliance through dentistry or orthodontics can be considered.  Other causes of the patient's symptoms, including circadian rhythm disturbances, an underlying mood disorder, medication effect and/or an underlying medical problem cannot be  ruled out based on this test. Clinical correlation is recommended.  The patient should be cautioned not to drive, work at heights, or operate dangerous or heavy equipment when tired or sleepy. Review and reiteration of good sleep hygiene measures should be pursued with any patient. The patient will be advised to follow up with her referring provider, who will be notified of the test results.   I certify that I have reviewed the raw data recording prior to the issuance of this report in accordance with the standards of the American Academy of Sleep Medicine (AASM).  INTERPRETING PHYSICIAN:   Star Age, MD, PhD  Board Certified in Neurology and Sleep Medicine  [facility] [address] [place],  [address] [phone]

## 2022-08-28 NOTE — Progress Notes (Signed)
See procedure note.

## 2022-09-02 ENCOUNTER — Telehealth: Payer: Self-pay

## 2022-09-02 NOTE — Telephone Encounter (Signed)
-----   Message from Star Age, MD sent at 08/28/2022  7:01 PM EDT ----- Patient referred by her PCP, seen by me on 07/08/2022, HST on 08/27/2022.   Please call and notify the patient that the recent home sleep test did not show any significant obstructive sleep apnea. Her total AHI was 0.7/hour, O2 nadir of 92%.  Snoring was detected, ranging from mild to louder. Treatment with a positive airway pressure device such as AutoPap or CPAP is not indicated. If she has disturbing snoring, an oral appliance through dentistry or orthodontics can be considered.  If she would like to explore the option of getting an oral appliance, we can facilitate with a referral to dentistry.  Please keep in mind, that a dental device for primary snoring may or may not be covered by her insurance.  At this juncture, she can follow-up with her primary care.   Thanks,  Star Age, MD, PhD [facility])

## 2022-09-02 NOTE — Telephone Encounter (Signed)
I called patient to discuss. No answer, left a message asking her to call us back. 

## 2022-09-03 NOTE — Telephone Encounter (Signed)
Patient returned my call.  I discussed her sleep study results and recommendations.  She will follow-up with her PCP.  She will let us know if she needs a dentistry referral for the oral appliance.  She was concerned about the number of times that she woke up during the night of her HST.  However, she will discuss this further with her primary care.  She verbalized understanding of results and had no further questions or concerns at this time.

## 2023-01-01 ENCOUNTER — Encounter: Payer: Self-pay | Admitting: Nurse Practitioner

## 2023-01-01 ENCOUNTER — Ambulatory Visit: Payer: 59 | Admitting: Nurse Practitioner

## 2023-01-01 VITALS — BP 110/76 | HR 82 | Temp 98.4°F | Ht 64.0 in | Wt 168.2 lb

## 2023-01-01 DIAGNOSIS — E559 Vitamin D deficiency, unspecified: Secondary | ICD-10-CM

## 2023-01-01 DIAGNOSIS — R040 Epistaxis: Secondary | ICD-10-CM

## 2023-01-01 DIAGNOSIS — E538 Deficiency of other specified B group vitamins: Secondary | ICD-10-CM | POA: Diagnosis not present

## 2023-01-01 LAB — CBC WITH DIFFERENTIAL/PLATELET
Basophils Absolute: 0 10*3/uL (ref 0.0–0.1)
Basophils Relative: 0.7 % (ref 0.0–3.0)
Eosinophils Absolute: 0.1 10*3/uL (ref 0.0–0.7)
Eosinophils Relative: 1.8 % (ref 0.0–5.0)
HCT: 38.2 % (ref 36.0–46.0)
Hemoglobin: 13.5 g/dL (ref 12.0–15.0)
Lymphocytes Relative: 32.6 % (ref 12.0–46.0)
Lymphs Abs: 1.7 10*3/uL (ref 0.7–4.0)
MCHC: 35.4 g/dL (ref 30.0–36.0)
MCV: 88.7 fl (ref 78.0–100.0)
Monocytes Absolute: 0.3 10*3/uL (ref 0.1–1.0)
Monocytes Relative: 5.9 % (ref 3.0–12.0)
Neutro Abs: 3.1 10*3/uL (ref 1.4–7.7)
Neutrophils Relative %: 59 % (ref 43.0–77.0)
Platelets: 283 10*3/uL (ref 150.0–400.0)
RBC: 4.3 Mil/uL (ref 3.87–5.11)
RDW: 12.6 % (ref 11.5–15.5)
WBC: 5.3 10*3/uL (ref 4.0–10.5)

## 2023-01-01 LAB — VITAMIN B12: Vitamin B-12: 896 pg/mL (ref 211–911)

## 2023-01-01 LAB — PROTIME-INR
INR: 1 ratio (ref 0.8–1.0)
Prothrombin Time: 11.4 s (ref 9.6–13.1)

## 2023-01-01 LAB — VITAMIN D 25 HYDROXY (VIT D DEFICIENCY, FRACTURES): VITD: 16.98 ng/mL — ABNORMAL LOW (ref 30.00–100.00)

## 2023-01-01 NOTE — Patient Instructions (Signed)
It was great to see you!  Start nasal saline spray (ocean spray) 2-3 times per day to help with moisturizing.   We are checking your labs today and will let you know the results via mychart/phone.   I have placed a referral to ENT  Let's follow-up if your symptoms worsen or don't improve.   Take care,  Vance Peper, NP

## 2023-01-01 NOTE — Progress Notes (Signed)
Acute Office Visit  Subjective:     Patient ID: Kathy Mcguire, female    DOB: 1991/09/07, 32 y.o.   MRN: [id number]  Chief Complaint  Patient presents with   Epistaxis    ongoing for years-bad episode 2 weeks ago, bruise easily    HPI Patient is in today for frequent nose bleeds and bruising easily.   She has been experiencing frequent nosebleeds that tend to happen in the winter. However, now she has already had 6-7 this year. She states they happen randomly. One recently, was really heavy lasting about 30 minutes and causing blood to come out of her mouth as well. She will sometimes take benadryl at bedtime. She used afrin about a year ago which caused her to have 2 nosebleeds after that. She will take zyrtec daily as needed for allergies. She has not taken this in the last few months. This is associated with easy bruising.    ROS See pertinent positives and negatives per HPI.    Objective:    BP 110/76 (BP Location: Right Arm)   Pulse 82   Temp 98.4 F (36.9 C)   Ht 5' 4"$  (1.626 m)   Wt 168 lb 3.2 oz (76.3 kg)   LMP 12/23/2022 (Exact Date)   SpO2 99%   BMI 28.87 kg/m    Physical Exam Vitals and nursing note reviewed.  Constitutional:      General: She is not in acute distress.    Appearance: Normal appearance.  HENT:     Head: Normocephalic.     Nose: Nose normal.     Right Turbinates: Enlarged and swollen.     Left Turbinates: Enlarged and swollen.  Eyes:     Conjunctiva/sclera: Conjunctivae normal.  Cardiovascular:     Rate and Rhythm: Normal rate and regular rhythm.     Pulses: Normal pulses.     Heart sounds: Normal heart sounds.  Pulmonary:     Effort: Pulmonary effort is normal.     Breath sounds: Normal breath sounds.  Musculoskeletal:     Cervical back: Normal range of motion.  Skin:    General: Skin is warm.  Neurological:     General: No focal deficit present.     Mental Status: She is alert and oriented to person, place, and time.   Psychiatric:        Mood and Affect: Mood normal.        Behavior: Behavior normal.        Thought Content: Thought content normal.        Judgment: Judgment normal.       Assessment & Plan:   Problem List Items Addressed This Visit       Other   Vitamin D insufficiency    She has started an over-the-counter vitamin D supplement daily.  Will check vitamin D levels today and adjust regimen based on results.      Relevant Orders   VITAMIN D 25 Hydroxy (Vit-D Deficiency, Fractures)   Vitamin B12 deficiency    She is taking a vitamin B12 supplement daily, will check levels today and adjust regimen based on results.      Relevant Orders   Vitamin B12   Frequent nosebleeds - Primary    She has been experiencing frequent nosebleeds, especially in the winter over the past several years.  However they have increased in frequency recently and she has had 6 or 7 in the last 6 weeks.  Will have her start nasal  saline spray 2-3 times per day and place a referral to ENT.  Check CBC, iron panel, PT/INR today.  Follow-up if symptoms worsen or do not improve.      Relevant Orders   CBC with Differential/Platelet   Iron, TIBC and Ferritin Panel   Protime-INR   Ambulatory referral to ENT    No orders of the defined types were placed in this encounter.   Return if symptoms worsen or fail to improve.  Charyl Dancer, NP

## 2023-01-01 NOTE — Assessment & Plan Note (Signed)
She has been experiencing frequent nosebleeds, especially in the winter over the past several years.  However they have increased in frequency recently and she has had 6 or 7 in the last 6 weeks.  Will have her start nasal saline spray 2-3 times per day and place a referral to ENT.  Check CBC, iron panel, PT/INR today.  Follow-up if symptoms worsen or do not improve.

## 2023-01-01 NOTE — Assessment & Plan Note (Signed)
She has started an over-the-counter vitamin D supplement daily.  Will check vitamin D levels today and adjust regimen based on results.

## 2023-01-01 NOTE — Assessment & Plan Note (Signed)
She is taking a vitamin B12 supplement daily, will check levels today and adjust regimen based on results.

## 2023-01-02 LAB — IRON,TIBC AND FERRITIN PANEL
%SAT: 26 % (calc) (ref 16–45)
Ferritin: 31 ng/mL (ref 16–154)
Iron: 93 ug/dL (ref 40–190)
TIBC: 354 mcg/dL (calc) (ref 250–450)

## 2023-01-02 MED ORDER — VITAMIN D (ERGOCALCIFEROL) 1.25 MG (50000 UNIT) PO CAPS: 50000.0000 [IU] | ORAL_CAPSULE | ORAL | 0 refills | Status: AC

## 2023-01-02 NOTE — Addendum Note (Signed)
Addended by: Vance Peper A on: 01/02/2023 08:19 AM   Modules accepted: Orders

## 2023-03-24 ENCOUNTER — Other Ambulatory Visit: Payer: Self-pay | Admitting: Nurse Practitioner

## 2023-03-25 NOTE — Telephone Encounter (Signed)
LVM to return call.

## 2023-03-26 NOTE — Telephone Encounter (Signed)
Left a detailed voice mail regarding below message.
